# Patient Record
Sex: Female | Born: 1960 | Race: Black or African American | Hispanic: No | Marital: Single | Smoking: Former smoker
Health system: Southern US, Community
[De-identification: ages and names within clinical notes are randomized; demographics above are authoritative.]

## PROBLEM LIST (undated history)

## (undated) DIAGNOSIS — Z21 Asymptomatic human immunodeficiency virus [HIV] infection status: Secondary | ICD-10-CM

## (undated) DIAGNOSIS — B20 Human immunodeficiency virus [HIV] disease: Secondary | ICD-10-CM

## (undated) DIAGNOSIS — F32A Depression, unspecified: Secondary | ICD-10-CM

## (undated) DIAGNOSIS — M1990 Unspecified osteoarthritis, unspecified site: Secondary | ICD-10-CM

## (undated) DIAGNOSIS — F329 Major depressive disorder, single episode, unspecified: Secondary | ICD-10-CM

## (undated) DIAGNOSIS — K219 Gastro-esophageal reflux disease without esophagitis: Secondary | ICD-10-CM

## (undated) DIAGNOSIS — B009 Herpesviral infection, unspecified: Secondary | ICD-10-CM

## (undated) DIAGNOSIS — L309 Dermatitis, unspecified: Secondary | ICD-10-CM

## (undated) DIAGNOSIS — G4700 Insomnia, unspecified: Secondary | ICD-10-CM

## (undated) DIAGNOSIS — E119 Type 2 diabetes mellitus without complications: Secondary | ICD-10-CM

## (undated) DIAGNOSIS — F419 Anxiety disorder, unspecified: Secondary | ICD-10-CM

## (undated) HISTORY — DX: Dermatitis, unspecified: L30.9

## (undated) HISTORY — DX: Insomnia, unspecified: G47.00

## (undated) HISTORY — PX: ABDOMINAL HYSTERECTOMY: SHX81

## (undated) HISTORY — DX: Unspecified osteoarthritis, unspecified site: M19.90

## (undated) HISTORY — DX: Depression, unspecified: F32.A

## (undated) HISTORY — DX: Type 2 diabetes mellitus without complications: E11.9

## (undated) HISTORY — DX: Human immunodeficiency virus (HIV) disease: B20

## (undated) HISTORY — DX: Major depressive disorder, single episode, unspecified: F32.9

## (undated) HISTORY — DX: Anxiety disorder, unspecified: F41.9

## (undated) HISTORY — DX: Gastro-esophageal reflux disease without esophagitis: K21.9

## (undated) HISTORY — PX: COLON SURGERY: SHX602

## (undated) HISTORY — DX: Herpesviral infection, unspecified: B00.9

## (undated) HISTORY — DX: Asymptomatic human immunodeficiency virus (hiv) infection status: Z21

---

## 2007-04-23 ENCOUNTER — Encounter: Admission: RE | Admit: 2007-04-23 | Discharge: 2007-04-23 | Payer: Self-pay | Admitting: Emergency Medicine

## 2010-05-10 ENCOUNTER — Other Ambulatory Visit: Payer: Self-pay | Admitting: Internal Medicine

## 2010-05-10 DIAGNOSIS — Z1231 Encounter for screening mammogram for malignant neoplasm of breast: Secondary | ICD-10-CM

## 2010-05-30 ENCOUNTER — Ambulatory Visit
Admission: RE | Admit: 2010-05-30 | Discharge: 2010-05-30 | Disposition: A | Discharge status: Home / Self Care | Payer: BC Managed Care – PPO | Source: Ambulatory Visit | Attending: Internal Medicine | Admitting: Internal Medicine

## 2010-05-30 DIAGNOSIS — Z1231 Encounter for screening mammogram for malignant neoplasm of breast: Secondary | ICD-10-CM

## 2010-06-02 ENCOUNTER — Other Ambulatory Visit: Payer: Self-pay | Admitting: Internal Medicine

## 2010-06-02 DIAGNOSIS — R928 Other abnormal and inconclusive findings on diagnostic imaging of breast: Secondary | ICD-10-CM

## 2010-06-08 ENCOUNTER — Ambulatory Visit
Admission: RE | Admit: 2010-06-08 | Discharge: 2010-06-08 | Disposition: A | Discharge status: Home / Self Care | Payer: BC Managed Care – PPO | Source: Ambulatory Visit | Attending: Internal Medicine | Admitting: Internal Medicine

## 2010-06-08 DIAGNOSIS — R928 Other abnormal and inconclusive findings on diagnostic imaging of breast: Secondary | ICD-10-CM

## 2010-06-10 ENCOUNTER — Other Ambulatory Visit: Payer: BC Managed Care – PPO

## 2011-02-02 DIAGNOSIS — K219 Gastro-esophageal reflux disease without esophagitis: Secondary | ICD-10-CM | POA: Insufficient documentation

## 2011-04-04 ENCOUNTER — Ambulatory Visit (INDEPENDENT_AMBULATORY_CARE_PROVIDER_SITE_OTHER): Payer: BC Managed Care – PPO

## 2011-04-04 DIAGNOSIS — E7220 Disorder of urea cycle metabolism, unspecified: Secondary | ICD-10-CM

## 2011-04-07 ENCOUNTER — Ambulatory Visit (INDEPENDENT_AMBULATORY_CARE_PROVIDER_SITE_OTHER): Payer: BC Managed Care – PPO

## 2011-04-07 DIAGNOSIS — G4700 Insomnia, unspecified: Secondary | ICD-10-CM

## 2011-04-07 DIAGNOSIS — E119 Type 2 diabetes mellitus without complications: Secondary | ICD-10-CM

## 2011-04-24 ENCOUNTER — Telehealth: Payer: Self-pay

## 2011-04-24 DIAGNOSIS — F5104 Psychophysiologic insomnia: Secondary | ICD-10-CM

## 2011-04-24 MED ORDER — ZOLPIDEM TARTRATE 10 MG PO TABS: 10.0000 mg | ORAL_TABLET | Freq: Every day | ORAL | Status: DC

## 2011-04-24 NOTE — Telephone Encounter (Signed)
.  [facility] FROM [facility] STATES CHELLE WANTED TO KNOW THE LAST TIME PT WAS GIVEN THE MEDICINE AND IT WAS 03-24-11 PLEASE CALL [phone] AND THE FAX IS [phone] IF NEEDED

## 2011-04-24 NOTE — Telephone Encounter (Signed)
Called [facility] at [phone], and notified them they could fill rx... But patient does not have any refills on her Ambien.  Can we rx?

## 2011-04-24 NOTE — Telephone Encounter (Signed)
Ok to fill med.

## 2011-04-24 NOTE — Telephone Encounter (Signed)
LMOM calling in Ambien rx to pharmacy.

## 2011-04-24 NOTE — Telephone Encounter (Signed)
Please call in rx (I ordered it to be phoned in)

## 2011-04-25 ENCOUNTER — Telehealth: Payer: Self-pay | Admitting: Internal Medicine

## 2011-04-25 MED ORDER — ZOLPIDEM TARTRATE ER 12.5 MG PO TBCR: 12.5000 mg | EXTENDED_RELEASE_TABLET | Freq: Every evening | ORAL | Status: DC | PRN

## 2011-04-25 NOTE — Telephone Encounter (Signed)
Pt came into clinic personally and requested AMbien 12.5 XR instead of the Ambien 10 mg sent to pharmacy yesterday in error.  [facility] by Launa Flight at 6:30 to verify Ambien 12.5 XR was correct medication. Ms. Jean Rosenthal called in new Ambien 12.5 prescription per verbal order from Eddie Candle, PA-C.   Pt was advised to return to pharmacy and pick up new prescription and return Ambien 10 mg given in error.

## 2011-05-16 ENCOUNTER — Other Ambulatory Visit: Payer: Self-pay | Admitting: Physician Assistant

## 2011-05-16 MED ORDER — CLONAZEPAM 2 MG PO TABS: ORAL_TABLET | ORAL | Status: DC

## 2011-05-22 ENCOUNTER — Other Ambulatory Visit: Payer: Self-pay | Admitting: Internal Medicine

## 2011-05-22 MED ORDER — ZOLPIDEM TARTRATE ER 12.5 MG PO TBCR: 12.5000 mg | EXTENDED_RELEASE_TABLET | Freq: Every evening | ORAL | Status: DC | PRN

## 2011-06-04 ENCOUNTER — Other Ambulatory Visit: Payer: Self-pay | Admitting: Physician Assistant

## 2011-06-05 ENCOUNTER — Telehealth: Payer: Self-pay | Admitting: Internal Medicine

## 2011-06-05 MED ORDER — AMITRIPTYLINE HCL 100 MG PO TABS: 100.0000 mg | ORAL_TABLET | Freq: Every day | ORAL | Status: DC

## 2011-06-05 NOTE — Telephone Encounter (Signed)
CJ consulted and Elavil refilled for 6 months.

## 2011-06-08 ENCOUNTER — Encounter: Payer: BC Managed Care – PPO | Admitting: Physician Assistant

## 2011-06-14 ENCOUNTER — Other Ambulatory Visit: Payer: Self-pay

## 2011-06-14 MED ORDER — CLONAZEPAM 2 MG PO TABS: ORAL_TABLET | ORAL | Status: DC

## 2011-06-15 ENCOUNTER — Other Ambulatory Visit: Payer: Self-pay | Admitting: Physician Assistant

## 2011-06-15 ENCOUNTER — Ambulatory Visit: Payer: BC Managed Care – PPO | Admitting: Physician Assistant

## 2011-06-27 ENCOUNTER — Encounter: Payer: Self-pay | Admitting: Physician Assistant

## 2011-06-27 ENCOUNTER — Ambulatory Visit: Payer: BC Managed Care – PPO

## 2011-06-27 ENCOUNTER — Ambulatory Visit (INDEPENDENT_AMBULATORY_CARE_PROVIDER_SITE_OTHER): Payer: BC Managed Care – PPO | Admitting: Emergency Medicine

## 2011-06-27 VITALS — BP 120/78 | HR 97 | Temp 98.4°F | Resp 16 | Ht 69.0 in | Wt 207.2 lb

## 2011-06-27 DIAGNOSIS — M25561 Pain in right knee: Secondary | ICD-10-CM

## 2011-06-27 DIAGNOSIS — L2089 Other atopic dermatitis: Secondary | ICD-10-CM

## 2011-06-27 DIAGNOSIS — M25562 Pain in left knee: Secondary | ICD-10-CM

## 2011-06-27 DIAGNOSIS — L209 Atopic dermatitis, unspecified: Secondary | ICD-10-CM

## 2011-06-27 DIAGNOSIS — M25569 Pain in unspecified knee: Secondary | ICD-10-CM

## 2011-06-27 DIAGNOSIS — G4700 Insomnia, unspecified: Secondary | ICD-10-CM

## 2011-06-27 MED ORDER — MELOXICAM 7.5 MG PO TABS: 7.5000 mg | ORAL_TABLET | Freq: Every day | ORAL | Status: DC

## 2011-06-27 MED ORDER — CYCLOBENZAPRINE HCL 10 MG PO TABS: 10.0000 mg | ORAL_TABLET | Freq: Three times a day (TID) | ORAL | Status: AC | PRN

## 2011-06-27 MED ORDER — BETAMETHASONE VALERATE 0.1 % EX CREA: TOPICAL_CREAM | Freq: Two times a day (BID) | CUTANEOUS | Status: DC

## 2011-06-27 MED ORDER — ZOLPIDEM TARTRATE ER 12.5 MG PO TBCR: 12.5000 mg | EXTENDED_RELEASE_TABLET | Freq: Every evening | ORAL | Status: DC | PRN

## 2011-06-27 NOTE — Progress Notes (Signed)
  Subjective:    Patient ID: Tracy Andrade, female    DOB: 14-May-1960, 51 y.o.   MRN: [id number]  HPI  Has developed a rash on the both wrist, abdomen and right upper thigh.  Symptoms began  on the thigh over a year ago, but the other areas began 1-2 weeks ago.  Has had same previously with goot relief with betamethasone valerate 0.1% cream.  Betamethasone proprionate 0.05% cream has not helped. Mildly itchy. Not associated with change in soap, detergent, lotion, etc.   Bilateral knee pain with sit to stand and stand to sit x 2 weeks.  Has tried a friend's hydrocodone, with good relief. OTC ibuprofen and acetaminophen and tramadol (also a friend's) without benefit. No pain with walking, standing.  No trauma/injury.  Friend from [place] has moved in, with her dog, yesterday.  Some stress, but working out the living arrangement.  Needs a refill of Flexeril for low back pain. 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  Review of Systems As above.    Objective:   Physical Exam Vital signs noted. Well-developed, well nourished BF who is awake, alert and oriented, in NAD. HEENT: [place]/AT, sclera and conjunctiva are clear.   Neck: supple, non-tender, no lymphadenopathy, thyromegaly. Heart: RRR, no murmur Lungs: CTA Skin: warm and dry with atopic rash on the right upper thigh, right volar wrist, left forearm, and anterior abdomen.  Bilateral Knee Films: [facility] reading (PRIMARY) by  Dr. Cleta Alberts. Medial joint space narrowing bilaterally.      Assessment & Plan:   1. Atopic dermatitis  betamethasone valerate (VALISONE) 0.1 % cream  2. Knee pain, bilateral  DG Knee 1-2 Views Left, DG Knee 1-2 Views Right, Ambulatory referral to Orthopedic Surgery, DISCONTINUED: meloxicam (MOBIC) 7.5 MG tablet  3. Insomnia  zolpidem (AMBIEN CR) 12.5 MG CR tablet   Discussed with Dr. Cleta Alberts.

## 2011-06-27 NOTE — Patient Instructions (Signed)
Call the office if you have not heard about your referral appointment by the end of the week.

## 2011-07-03 ENCOUNTER — Telehealth: Payer: Self-pay

## 2011-07-03 NOTE — Telephone Encounter (Signed)
PT REQUESTS X-RAY DISCS FROM HER LAST VISIT  APPT HAS BEEN SET UP W/ ORTHO FOR 07/08/11 AND SHE NEEDS TO TAKE THESE WITH HER TO HER APPT   PLEASE CALL WHEN READY

## 2011-07-04 NOTE — Telephone Encounter (Signed)
Cd made and pt notified on VM ready for pick up.

## 2011-07-10 DIAGNOSIS — N893 Dysplasia of vagina, unspecified: Secondary | ICD-10-CM | POA: Insufficient documentation

## 2011-07-14 ENCOUNTER — Other Ambulatory Visit: Payer: Self-pay | Admitting: Physician Assistant

## 2011-07-14 ENCOUNTER — Telehealth: Payer: Self-pay | Admitting: Internal Medicine

## 2011-07-14 MED ORDER — CLONAZEPAM 2 MG PO TABS: ORAL_TABLET | ORAL | Status: DC

## 2011-07-14 MED ORDER — AMITRIPTYLINE HCL 100 MG PO TABS: 100.0000 mg | ORAL_TABLET | Freq: Every day | ORAL | Status: DC

## 2011-07-14 NOTE — Telephone Encounter (Signed)
Refilled Klonopin #[id number] no refills.

## 2011-07-22 ENCOUNTER — Ambulatory Visit (INDEPENDENT_AMBULATORY_CARE_PROVIDER_SITE_OTHER): Payer: BC Managed Care – PPO | Admitting: Physician Assistant

## 2011-07-22 VITALS — BP 109/79 | HR 102 | Temp 98.2°F | Resp 18 | Ht 69.0 in | Wt 204.6 lb

## 2011-07-22 DIAGNOSIS — M549 Dorsalgia, unspecified: Secondary | ICD-10-CM

## 2011-07-22 DIAGNOSIS — B20 Human immunodeficiency virus [HIV] disease: Secondary | ICD-10-CM

## 2011-07-22 DIAGNOSIS — M25569 Pain in unspecified knee: Secondary | ICD-10-CM

## 2011-07-22 DIAGNOSIS — L259 Unspecified contact dermatitis, unspecified cause: Secondary | ICD-10-CM

## 2011-07-22 DIAGNOSIS — L309 Dermatitis, unspecified: Secondary | ICD-10-CM

## 2011-07-22 DIAGNOSIS — F419 Anxiety disorder, unspecified: Secondary | ICD-10-CM

## 2011-07-22 DIAGNOSIS — F411 Generalized anxiety disorder: Secondary | ICD-10-CM

## 2011-07-22 DIAGNOSIS — G4700 Insomnia, unspecified: Secondary | ICD-10-CM

## 2011-07-22 DIAGNOSIS — F329 Major depressive disorder, single episode, unspecified: Secondary | ICD-10-CM

## 2011-07-22 MED ORDER — HYDROCODONE-ACETAMINOPHEN 5-325 MG PO TABS: 1.0000 | ORAL_TABLET | Freq: Four times a day (QID) | ORAL | Status: AC | PRN

## 2011-07-22 MED ORDER — AMITRIPTYLINE HCL 100 MG PO TABS: 100.0000 mg | ORAL_TABLET | Freq: Every day | ORAL | Status: DC

## 2011-07-22 MED ORDER — SERTRALINE HCL 100 MG PO TABS: 150.0000 mg | ORAL_TABLET | Freq: Every day | ORAL | Status: DC

## 2011-07-22 NOTE — Patient Instructions (Addendum)
Come in to see Dr. Eula Listen on Tuesday 08/01/11 at 6 pm about your back and knee pain. Use good body mechanics when working with your clients.

## 2011-07-22 NOTE — Progress Notes (Signed)
  Subjective:    Patient ID: Tracy Andrade, female    DOB: 1961-02-12, 51 y.o.   MRN: [id number]  HPI  Presents with several concerns: 1. Back pain has recurred.  Meloxicam and Flexeril are no longer effective. 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  She is able to do the work, but when it's time to straighten back up, she experiences severe low back pain. No radiation, no paresthesias, no weakness, no loss of bowel or bladder control.  2. Bilateral knee pain with walking, rising to stand from seated position.  No swelling.  Meloxicam no longer effective.  3. The itchy rash on her trunk, around the waist band hasn't resolved, but is less itchy.  She has developed similar lesions on the back of her thighs and right axilla.  Bathes not more than once daily.  Uses warm, not hot, water.  4. Her position at [employer] is being eliminated in down-sizing.  Her last day will be 5/24.  She will be paid her accumulated PTO, but will lose her health insurance unless she pays for COBRA.  She is working with the staff at [facility] to enroll in patient assistance programs for her HIV meds, each of which cost >$1000/month.  Review of Systems As above.    Objective:   Physical Exam Vital signs noted. Well-developed, well nourished BF who is awake, alert and oriented, in NAD. HEENT: [place]/AT, sclera and conjunctiva are clear.   Neck: supple, non-tender, no lymphadenopathy, thyromegaly. Heart: RRR, no murmur Lungs: CTA Abdomen: normo-active bowel sounds, supple, non-tender, no mass or organomegaly. Extremities: no cyanosis, clubbing or edema. Skin: warm and very dry. Mildly scaled and hyperpigmented rounded lesions round the anterior waist line, posterior upper thighs, and right upper arm/shoulder area. No induration. No drainage.     Assessment & Plan:   1. Depression  sertraline (ZOLOFT) 100 MG tablet, amitriptyline (ELAVIL) 100 MG tablet  2. Insomnia   amitriptyline (ELAVIL) 100 MG tablet  3. Eczema  Continue with good skin hygiene and use of steroid cream.  4. HIV (human immunodeficiency virus infection)  Continue F/U at [facility]  5. Anxiety  sertraline (ZOLOFT) 100 MG tablet, amitriptyline (ELAVIL) 100 MG tablet  6. Back pain  HYDROcodone-acetaminophen (NORCO) 5-325 MG per tablet. Institute proper body mechanics to reduce strain on back, increase core muscle strength. If she elects COBRA coverage, will refer to PT to assist.  7. Knee pain  HYDROcodone-acetaminophen (NORCO) 5-325 MG per tablet. RTC 5/14 to see Eula Listen, MD.   Patient Instructions  Come in to see Dr. Eula Listen on Tuesday 08/01/11 at 6 pm about your back and knee pain. Use good body mechanics when working with your clients.

## 2011-08-01 ENCOUNTER — Ambulatory Visit (INDEPENDENT_AMBULATORY_CARE_PROVIDER_SITE_OTHER): Payer: BC Managed Care – PPO | Admitting: Family Medicine

## 2011-08-01 VITALS — BP 129/81 | HR 85 | Temp 97.9°F | Resp 16 | Ht 69.18 in | Wt 203.6 lb

## 2011-08-01 DIAGNOSIS — M25569 Pain in unspecified knee: Secondary | ICD-10-CM

## 2011-08-01 DIAGNOSIS — M545 Low back pain: Secondary | ICD-10-CM

## 2011-08-01 MED ORDER — CYCLOBENZAPRINE HCL 10 MG PO TABS: ORAL_TABLET | ORAL | Status: DC

## 2011-08-01 MED ORDER — DICLOFENAC SODIUM 1 % TD GEL: 1.0000 "application " | Freq: Three times a day (TID) | TRANSDERMAL | Status: DC

## 2011-08-01 NOTE — Progress Notes (Signed)
  Subjective:    Patient ID: Tracy Andrade, female    DOB: Jul 27, 1960, 51 y.o.   MRN: [id number]  HPI Comments: F/U for bilateral knee pain.  Painful for a couple weeks.  No swelling, locking or giving way. Using hydrocodone and meloxicam for pain.  No relief.  Knee pain worse with squatting.  Also,  C/o LBP.  Localized.  No intermittent "all over my body" numbness.  No fever or night pain. Intentional few pound weight loss.   Knee Pain       Review of Systems     Objective:   Physical Exam  Cardiovascular:       No swelling  Skin: Skin is warm, dry and intact. No abrasion, no bruising and no rash noted. No erythema.   WDWN.NAD Low Back:  NT. Neg. SLR  With seated or lying Neuro: full strength.  Normal DTRs. Normal muscle tone Knees: No swelling.  Mildly positive patella compression test bilaterally.  Stable ligaments. Neg mcmurray's bilaterally.        Assessment & Plan:   1. Lower back pain  cyclobenzaprine (FLEXERIL) 10 MG tablet  2. Knee pain  diclofenac sodium (VOLTAREN) 1 % GEL  Refer to PT for back and knee pain 2 times per week for 4 weeks. Recommended water aerobics. Follow up as needed.

## 2011-08-01 NOTE — Patient Instructions (Signed)
Back Pain, Adult Low back pain is very common. About 1 in 5 people have back pain.The cause of low back pain is rarely dangerous. The pain often gets better over time.About half of people with a sudden onset of back pain feel better in just 2 weeks. About 8 in 10 people feel better by 6 weeks.  CAUSES Some common causes of back pain include:  Strain of the muscles or ligaments supporting the spine.   Wear and tear (degeneration) of the spinal discs.   Arthritis.   Direct injury to the back.  DIAGNOSIS Most of the time, the direct cause of low back pain is not known.However, back pain can be treated effectively even when the exact cause of the pain is unknown.Answering your caregiver's questions about your overall health and symptoms is one of the most accurate ways to make sure the cause of your pain is not dangerous. If your caregiver needs more information, he or she may order lab work or imaging tests (X-rays or MRIs).However, even if imaging tests show changes in your back, this usually does not require surgery. HOME CARE INSTRUCTIONS For many people, back pain returns.Since low back pain is rarely dangerous, it is often a condition that people can learn to manageon their own.   Remain active. It is stressful on the back to sit or stand in one place. Do not sit, drive, or stand in one place for more than 30 minutes at a time. Take short walks on level surfaces as soon as pain allows.Try to increase the length of time you walk each day.   Do not stay in bed.Resting more than 1 or 2 days can delay your recovery.   Do not avoid exercise or work.Your body is made to move.It is not dangerous to be active, even though your back may hurt.Your back will likely heal faster if you return to being active before your pain is gone.   Pay attention to your body when you bend and lift. Many people have less discomfortwhen lifting if they bend their knees, keep the load close to their  bodies,and avoid twisting. Often, the most comfortable positions are those that put less stress on your recovering back.   Find a comfortable position to sleep. Use a firm mattress and lie on your side with your knees slightly bent. If you lie on your back, put a pillow under your knees.   Only take over-the-counter or prescription medicines as directed by your caregiver. Over-the-counter medicines to reduce pain and inflammation are often the most helpful.Your caregiver may prescribe muscle relaxant drugs.These medicines help dull your pain so you can more quickly return to your normal activities and healthy exercise.   Put ice on the injured area.   Put ice in a plastic bag.   Place a towel between your skin and the bag.   Leave the ice on for 15 to 20 minutes, 3 to 4 times a day for the first 2 to 3 days. After that, ice and heat may be alternated to reduce pain and spasms.   Ask your caregiver about trying back exercises and gentle massage. This may be of some benefit.   Avoid feeling anxious or stressed.Stress increases muscle tension and can worsen back pain.It is important to recognize when you are anxious or stressed and learn ways to manage it.Exercise is a great option.  SEEK MEDICAL CARE IF:  You have pain that is not relieved with rest or medicine.   You have   pain that does not improve in 1 week.   You have new symptoms.   You are generally not feeling well.  SEEK IMMEDIATE MEDICAL CARE IF:   You have pain that radiates from your back into your legs.   You develop new bowel or bladder control problems.   You have unusual weakness or numbness in your arms or legs.   You develop nausea or vomiting.   You develop abdominal pain.   You feel faint.  Document Released: 03/06/2005 Document Revised: 02/23/2011 Document Reviewed: 07/25/2010 [facility] Patient Information 2012 [place], [place].Knee Pain The knee is the complex joint between your thigh and your lower leg.  It is made up of bones, tendons, ligaments, and cartilage. The bones that make up the knee are:  The femur in the thigh.   The tibia and fibula in the lower leg.   The patella or kneecap riding in the groove on the lower femur.  CAUSES  Knee pain is a common complaint with many causes. A few of these causes are:  Injury, such as:   A ruptured ligament or tendon injury.   Torn cartilage.   Medical conditions, such as:   Gout   Arthritis   Infections   Overuse, over training or overdoing a physical activity.  Knee pain can be minor or severe. Knee pain can accompany debilitating injury. Minor knee problems often respond well to self-care measures or get well on their own. More serious injuries may need medical intervention or even surgery. SYMPTOMS The knee is complex. Symptoms of knee problems can vary widely. Some of the problems are:  Pain with movement and weight bearing.   Swelling and tenderness.   Buckling of the knee.   Inability to straighten or extend your knee.   Your knee locks and you cannot straighten it.   Warmth and redness with pain and fever.   Deformity or dislocation of the kneecap.  DIAGNOSIS  Determining what is wrong may be very straight forward such as when there is an injury. It can also be challenging because of the complexity of the knee. Tests to make a diagnosis may include:  Your caregiver taking a history and doing a physical exam.   Routine X-rays can be used to rule out other problems. X-rays will not reveal a cartilage tear. Some injuries of the knee can be diagnosed by:   Arthroscopy a surgical technique by which a small video camera is inserted through tiny incisions on the sides of the knee. This procedure is used to examine and repair internal knee joint problems. Tiny instruments can be used during arthroscopy to repair the torn knee cartilage (meniscus).   Arthrography is a radiology technique. A contrast liquid is directly  injected into the knee joint. Internal structures of the knee joint then become visible on X-ray film.   An MRI scan is a non x-ray radiology procedure in which magnetic fields and a computer produce two- or three-dimensional images of the inside of the knee. Cartilage tears are often visible using an MRI scanner. MRI scans have largely replaced arthrography in diagnosing cartilage tears of the knee.   Blood work.   Examination of the fluid that helps to lubricate the knee joint (synovial fluid). This is done by taking a sample out using a needle and a syringe.  TREATMENT The treatment of knee problems depends on the cause. Some of these treatments are:  Depending on the injury, proper casting, splinting, surgery or physical therapy care will  be needed.   Give yourself adequate recovery time. Do not overuse your joints. If you begin to get sore during workout routines, back off. Slow down or do fewer repetitions.   For repetitive activities such as cycling or running, maintain your strength and nutrition.   Alternate muscle groups. For example if you are a weight lifter, work the upper body on one day and the lower body the next.   Either tight or weak muscles do not give the proper support for your knee. Tight or weak muscles do not absorb the stress placed on the knee joint. Keep the muscles surrounding the knee strong.   Take care of mechanical problems.   If you have flat feet, orthotics or special shoes may help. See your caregiver if you need help.   Arch supports, sometimes with wedges on the inner or outer aspect of the heel, can help. These can shift pressure away from the side of the knee most bothered by osteoarthritis.   A brace called an "unloader" brace also may be used to help ease the pressure on the most arthritic side of the knee.   If your caregiver has prescribed crutches, braces, wraps or ice, use as directed. The acronym for this is PRICE. This means protection,  rest, ice, compression and elevation.   Nonsteroidal anti-inflammatory drugs (NSAID's), can help relieve pain. But if taken immediately after an injury, they may actually increase swelling. Take NSAID's with food in your stomach. Stop them if you develop stomach problems. Do not take these if you have a history of ulcers, stomach pain or bleeding from the bowel. Do not take without your caregiver's approval if you have problems with fluid retention, heart failure, or kidney problems.   For ongoing knee problems, physical therapy may be helpful.   Glucosamine and chondroitin are over-the-counter dietary supplements. Both may help relieve the pain of osteoarthritis in the knee. These medicines are different from the usual anti-inflammatory drugs. Glucosamine may decrease the rate of cartilage destruction.   Injections of a corticosteroid drug into your knee joint may help reduce the symptoms of an arthritis flare-up. They may provide pain relief that lasts a few months. You may have to wait a few months between injections. The injections do have a small increased risk of infection, water retention and elevated blood sugar levels.   Hyaluronic acid injected into damaged joints may ease pain and provide lubrication. These injections may work by reducing inflammation. A series of shots may give relief for as long as 6 months.   Topical painkillers. Applying certain ointments to your skin may help relieve the pain and stiffness of osteoarthritis. Ask your pharmacist for suggestions. Many over the-counter products are approved for temporary relief of arthritis pain.   In some countries, doctors often prescribe topical NSAID's for relief of chronic conditions such as arthritis and tendinitis. A review of treatment with NSAID creams found that they worked as well as oral medications but without the serious side effects.  PREVENTION  Maintain a healthy weight. Extra pounds put more strain on your joints.    Get strong, stay limber. Weak muscles are a common cause of knee injuries. Stretching is important. Include flexibility exercises in your workouts.   Be smart about exercise. If you have osteoarthritis, chronic knee pain or recurring injuries, you may need to change the way you exercise. This does not mean you have to stop being active. If your knees ache after jogging or playing basketball, consider switching  to swimming, water aerobics or other low-impact activities, at least for a few days a week. Sometimes limiting high-impact activities will provide relief.   Make sure your shoes fit well. Choose footwear that is right for your sport.   Protect your knees. Use the proper gear for knee-sensitive activities. Use kneepads when playing volleyball or laying carpet. Buckle your seat belt every time you drive. Most shattered kneecaps occur in car accidents.   Rest when you are tired.  SEEK MEDICAL CARE IF:  You have knee pain that is continual and does not seem to be getting better.  SEEK IMMEDIATE MEDICAL CARE IF:  Your knee joint feels hot to the touch and you have a high fever. MAKE SURE YOU:   Understand these instructions.   Will watch your condition.   Will get help right away if you are not doing well or get worse.  Document Released: 01/01/2007 Document Revised: 02/23/2011 Document Reviewed: 01/01/2007 [facility] Patient Information 2012 [place], [place].

## 2011-08-04 NOTE — Progress Notes (Signed)
Completed prior auth form for pt's Rx for Voltaren gel and form being faxed to [facility] of [place].

## 2011-08-04 NOTE — Progress Notes (Signed)
Prior auth form completed and faxed in for pt's Rx for voltaren gel.

## 2011-08-07 ENCOUNTER — Other Ambulatory Visit: Payer: Self-pay | Admitting: Physician Assistant

## 2011-08-09 ENCOUNTER — Other Ambulatory Visit: Payer: Self-pay | Admitting: Family Medicine

## 2011-08-09 DIAGNOSIS — G4700 Insomnia, unspecified: Secondary | ICD-10-CM

## 2011-08-09 MED ORDER — ZOLPIDEM TARTRATE ER 12.5 MG PO TBCR: 12.5000 mg | EXTENDED_RELEASE_TABLET | Freq: Every evening | ORAL | Status: DC | PRN

## 2011-08-09 MED ORDER — CLONAZEPAM 2 MG PO TABS: ORAL_TABLET | ORAL | Status: DC

## 2011-08-10 ENCOUNTER — Other Ambulatory Visit: Payer: Self-pay

## 2011-08-10 NOTE — Telephone Encounter (Signed)
betamethasone valerate (VALISONE) 0.1 % cream Pharmacy has requested this via e-script and has had no reply

## 2011-08-10 NOTE — Progress Notes (Signed)
Called to check on prior auth status and was told it was denied because "NSAID had not been tried/failed and would be used w/ the voltaren gel", but was told I could send in a note w/clarification/additional info to appeal decision. Wrote note and faxed to [facility] [place] stating that pt has been using/tried and failed ibuprofen and then meloxicam beginning w/ 4/9 OV and was Rxd Voltaren at last OV, along w/Norco since the NSAIDS were not working. Received confirmation fax went through.

## 2011-08-11 NOTE — Telephone Encounter (Signed)
Done via re-fill request encounter

## 2011-08-15 ENCOUNTER — Other Ambulatory Visit: Payer: Self-pay | Admitting: Physician Assistant

## 2011-08-15 ENCOUNTER — Telehealth: Payer: Self-pay

## 2011-08-15 NOTE — Telephone Encounter (Signed)
Tracy Andrade  Pt would like for you to call her regarding her knees

## 2011-08-15 NOTE — Telephone Encounter (Signed)
Pt called stating that her pharmacy has not received he rx for hydrocodone.

## 2011-08-16 NOTE — Telephone Encounter (Signed)
Pt CB to ask about status of RF req for the hydrocodone that Chelle had Rxd. Stated that her pharmacy has requested the RF but they haven't heard back. Chelle, pt wanted to talk with you to let you know she is still having the same amount of pain in knee. Dr Althea Charon had wanted her to see a physical therapist, but they could not see her until June 3rd. Pt reports that she got laid off of her job and her insurance runs out 5/31. She can not afford to go to the PT w/out insurance. Pt also wanted me to let Chelle know that Dr Althea Charon had wanted her to use a topical cream for knee pain but it was not covered by her ins, and so she is calling back to see if she can get a RF of the hydrocodone that Chelle Rxd on 07/22/11. Also wanted Chelle's advice as to what she can do for f/up w/out insurance.

## 2011-08-16 NOTE — Telephone Encounter (Signed)
Ok x 1

## 2011-08-17 ENCOUNTER — Other Ambulatory Visit: Payer: Self-pay | Admitting: *Deleted

## 2011-08-17 MED ORDER — HYDROCODONE-ACETAMINOPHEN 5-325 MG PO TABS: 1.0000 | ORAL_TABLET | Freq: Four times a day (QID) | ORAL | Status: AC | PRN

## 2011-08-17 NOTE — Telephone Encounter (Signed)
Pt CB to ask again about her medication and wanted Chelle to know that the ins has denied the topical cream for pain twice, even after we appealed it.

## 2011-08-17 NOTE — Telephone Encounter (Signed)
The hydrocodone has been authorized and sent to the pharmacy.  Please call and verify they received it.    Without insurance, she can call the [facility] financial aid office [phone]), and Christena Flake here [phone] can help if they are not forthcoming.

## 2011-08-18 NOTE — Telephone Encounter (Signed)
Advised pt that rx was sent in 

## 2011-08-28 ENCOUNTER — Other Ambulatory Visit: Payer: Self-pay | Admitting: Physician Assistant

## 2011-08-29 ENCOUNTER — Telehealth: Payer: Self-pay

## 2011-08-29 NOTE — Telephone Encounter (Signed)
PATIENT WAS HERE RECENTLY AND IS STILL HAVING A COUGH.  SHE NO LONGER HAS INSURANCE AND WAS ASKING ABOUT COSTS.  I THINK SHE HAS SOME QUESTIONS ABOUT THIS LINGERING COUGH AND IF WE SHOULD CALL HER IN SOMETHING

## 2011-08-29 NOTE — Telephone Encounter (Signed)
Does not appear pt has been here for a cough since we've been on EPIC. Please advise

## 2011-08-30 MED ORDER — BENZONATATE 100 MG PO CAPS: 100.0000 mg | ORAL_CAPSULE | Freq: Three times a day (TID) | ORAL | Status: AC | PRN

## 2011-08-30 NOTE — Telephone Encounter (Signed)
Left message for call back.

## 2011-08-30 NOTE — Telephone Encounter (Signed)
Please advise patient that I sent in [facility] for her cough.  If her cough persists, we'll need to re-evaluate her here.  Remind her to call the financial aid office (I gave her the number in a previous message [phone], and call Okey Regal here at ext [phone] if she has any problems), as they can help reduce the cost of her visits here!

## 2011-08-31 NOTE — Telephone Encounter (Signed)
Patient notified

## 2011-09-03 ENCOUNTER — Other Ambulatory Visit: Payer: Self-pay | Admitting: Physician Assistant

## 2011-09-11 ENCOUNTER — Telehealth: Payer: Self-pay

## 2011-09-11 DIAGNOSIS — G4700 Insomnia, unspecified: Secondary | ICD-10-CM

## 2011-09-11 MED ORDER — CLONAZEPAM 2 MG PO TABS: ORAL_TABLET | ORAL | Status: DC

## 2011-09-11 MED ORDER — ZOLPIDEM TARTRATE ER 12.5 MG PO TBCR: 12.5000 mg | EXTENDED_RELEASE_TABLET | Freq: Every evening | ORAL | Status: DC | PRN

## 2011-09-11 NOTE — Telephone Encounter (Signed)
Rxs printed.  

## 2011-09-11 NOTE — Telephone Encounter (Signed)
Patient of Chelle.  Says pharmacy sent request of Janit Bern & Klonopin on the 20th.  She is completely out of her medicine.  Please call

## 2011-09-12 NOTE — Telephone Encounter (Signed)
[facility] [facility] for pt that request has been done.

## 2011-10-10 ENCOUNTER — Other Ambulatory Visit: Payer: Self-pay

## 2011-10-10 DIAGNOSIS — G4700 Insomnia, unspecified: Secondary | ICD-10-CM

## 2011-10-10 MED ORDER — CLONAZEPAM 2 MG PO TABS: ORAL_TABLET | ORAL | Status: DC

## 2011-10-10 MED ORDER — ZOLPIDEM TARTRATE ER 12.5 MG PO TBCR: 12.5000 mg | EXTENDED_RELEASE_TABLET | Freq: Every evening | ORAL | Status: DC | PRN

## 2011-10-16 ENCOUNTER — Telehealth: Payer: Self-pay

## 2011-10-16 DIAGNOSIS — F329 Major depressive disorder, single episode, unspecified: Secondary | ICD-10-CM

## 2011-10-16 DIAGNOSIS — G4700 Insomnia, unspecified: Secondary | ICD-10-CM

## 2011-10-16 DIAGNOSIS — F419 Anxiety disorder, unspecified: Secondary | ICD-10-CM

## 2011-10-16 NOTE — Telephone Encounter (Signed)
CHELLE:  PT STATES SHE NO LONGER HAS HEALTH INSURANCE AND [facility] IS ABLE TO OFFER HER A DISCOUNT PROGRAM BUT THE PROGRAM DOES NOT COVER AMBIEN CR OR KLONOPIN AND WOULD LIKE SOMETHING ELSE PRESCRIBED AND WOULD LIKE A CALL BACK FROM YOU   THE NAME OF THE PROGRAM IS "AIDAT" (PT NOT SURE OF SPELLING) SAYS IT IS A FORM OF MEDICAID    PT PHONE [phone]

## 2011-10-17 NOTE — Telephone Encounter (Signed)
Ambien CR $ 50 and the Klonopin $76 can not afford these (not covered). Please advise if we should change these, she has AIDAT plan that covers her HIV meds will also cover the Elavil 100 mg at bedtime and Zoloft 100mg  takes 1 and 1/2 daily needs these sent to [facility] 800 [phone]

## 2011-10-17 NOTE — Telephone Encounter (Signed)
We can change from Ambien CR to regular Ambien. Recommend getting both Klonopin and Ambien filled at [facility] and likely will be cheaper.

## 2011-10-17 NOTE — Telephone Encounter (Signed)
Lm for her, need to know what is covered.

## 2011-10-17 NOTE — Telephone Encounter (Signed)
Understood, but what about the Elavil and Zoloft?  Can we rx these to [facility]?

## 2011-10-18 MED ORDER — SERTRALINE HCL 100 MG PO TABS: 150.0000 mg | ORAL_TABLET | Freq: Every day | ORAL | Status: DC

## 2011-10-18 MED ORDER — ZOLPIDEM TARTRATE 10 MG PO TABS: 10.0000 mg | ORAL_TABLET | Freq: Every evening | ORAL | Status: DC | PRN

## 2011-10-18 MED ORDER — AMITRIPTYLINE HCL 100 MG PO TABS: 100.0000 mg | ORAL_TABLET | Freq: Every day | ORAL | Status: DC

## 2011-10-18 NOTE — Telephone Encounter (Signed)
Tracy Andrade can be sent to [facility] order

## 2011-10-18 NOTE — Telephone Encounter (Signed)
Sent Rx for her to [facility] order and left mssg for her to call me back about these, I need to advise her on plan for Ambien and Klonopin

## 2011-10-19 NOTE — Telephone Encounter (Signed)
Pt CB and I advised her that wlavil and zoloft have been sent to her mail order. Suggested that pt call [facility] and possibly [facility] to check cost for Ambien, Ambien CR and Klonopin and then let us know if and where she would like these Rxs sent. Pt agreed.

## 2011-10-20 NOTE — Telephone Encounter (Signed)
The patient called and stated that she wanted her Zoloft and Amitriptyline sent into the [facility] # [phone].  The patient stated this is part of the [employer] ?sp program and they cover these meds.  The patient stated they do not cover her other other medicines.  The patient would like a return call regarding this at [phone].

## 2011-10-20 NOTE — Telephone Encounter (Signed)
I called her and she want the Ambien [facility]. She was advised the other Rx have been sent

## 2011-10-21 ENCOUNTER — Telehealth: Payer: Self-pay

## 2011-10-21 MED ORDER — CLONAZEPAM 2 MG PO TABS: ORAL_TABLET | ORAL | Status: DC

## 2011-10-21 NOTE — Telephone Encounter (Signed)
Sent in

## 2011-10-21 NOTE — Telephone Encounter (Signed)
Pt. Needs prescription for Clonazepam to be faxed to [facility]. Pt stated this Was left out from previous request. Please let pt know when done.  [phone]

## 2011-10-21 NOTE — Telephone Encounter (Signed)
Faxed to [facility]. Called pt to let her know.

## 2011-11-10 ENCOUNTER — Other Ambulatory Visit: Payer: Self-pay | Admitting: Physician Assistant

## 2011-11-22 ENCOUNTER — Telehealth: Payer: Self-pay

## 2011-11-22 NOTE — Telephone Encounter (Signed)
PT STATES SHE WAS AROUND HER BOSS AND HER BOSS HAD THE PINK [employer] AND NOW SHE HAS IT. WOULD LIKE TO HAVE SOMETHING CALLED IN SINCE SHE DOESN'T HAVE INSURANCE. PLEASE CALL [phone]    [facility]

## 2011-11-22 NOTE — Telephone Encounter (Signed)
Please call patient and get details of her eye symptoms.

## 2011-11-23 ENCOUNTER — Ambulatory Visit: Payer: BC Managed Care – PPO | Admitting: Physician Assistant

## 2011-11-23 NOTE — Telephone Encounter (Signed)
LMOM to CB. 

## 2011-11-23 NOTE — Telephone Encounter (Signed)
Called pt LMOM to CB. 

## 2011-11-24 NOTE — Telephone Encounter (Signed)
LMOM to CB if she still needs our help with this?

## 2011-11-30 ENCOUNTER — Ambulatory Visit: Payer: BC Managed Care – PPO | Admitting: Physician Assistant

## 2011-12-05 ENCOUNTER — Other Ambulatory Visit: Payer: Self-pay | Admitting: Physician Assistant

## 2011-12-06 ENCOUNTER — Other Ambulatory Visit: Payer: Self-pay | Admitting: Physician Assistant

## 2011-12-07 ENCOUNTER — Telehealth: Payer: Self-pay

## 2011-12-07 NOTE — Telephone Encounter (Signed)
Rendy needs Ambien refill that she requested from [facility] on [address], but was not received.  ambien 10 mg 1xdaily.  [phone]

## 2011-12-08 ENCOUNTER — Other Ambulatory Visit: Payer: Self-pay | Admitting: *Deleted

## 2011-12-08 MED ORDER — ZOLPIDEM TARTRATE 10 MG PO TABS: 10.0000 mg | ORAL_TABLET | Freq: Every evening | ORAL | Status: DC | PRN

## 2011-12-08 NOTE — Telephone Encounter (Signed)
I have authorized this on a paper request. It will be going through shortly. Either way she should be fine until 12/11/11 as her last refill was on 11/10/11.

## 2011-12-08 NOTE — Telephone Encounter (Signed)
Notified pt RF was sent to [facility].

## 2011-12-08 NOTE — Telephone Encounter (Signed)
It looks like we just RFd her Klonopin, but I do not see a request from pharm for pt's Ambien. Can we RF?

## 2011-12-12 ENCOUNTER — Telehealth: Payer: Self-pay

## 2011-12-12 NOTE — Telephone Encounter (Signed)
PT STATES SHE HAVE LOST HER JOB AND CANNOT AFFORD TO COME IN, SHE WAS ON AMBIEN CR AND IT WORKED REALLY GOOD BUT SHE CAN'T AFFORD IT AND THE REGULAR AMBIEN DOESN'T WORK. WOULD LIKE TO KNOW IF SHE CAN DOUBLE UP ON THE KLONOPIN OR CAN SHE TAKE SOMETHING ELSE. HAVE TRIED OVER THE COUNTER MEDICINE AND IT DOESN'T HELP EITHER. PLEASE CALL [phone]    [facility]

## 2011-12-12 NOTE — Telephone Encounter (Signed)
She is already on the max dose for both medications for her diagnosis. Has she tried any other prescription medications for sleep?

## 2011-12-13 NOTE — Telephone Encounter (Signed)
I have left message for her to call me back  

## 2011-12-13 NOTE — Telephone Encounter (Signed)
She states she has only tried Ambien and Ambien CR she is asking if she can try Trazodone? Would like to try this she has not tried before.

## 2011-12-14 ENCOUNTER — Telehealth: Payer: Self-pay

## 2011-12-14 DIAGNOSIS — G4700 Insomnia, unspecified: Secondary | ICD-10-CM

## 2011-12-14 MED ORDER — TRAZODONE HCL 50 MG PO TABS: 25.0000 mg | ORAL_TABLET | Freq: Every evening | ORAL | Status: DC | PRN

## 2011-12-14 NOTE — Telephone Encounter (Signed)
PT IS STILL WAITING TO HEAR FROM [place] ABOUT SWITCHING HER SLEEPING MEDICATION.  WOULD LIKE TO TRY 100 MG TRAZADONE.  [phone]

## 2011-12-14 NOTE — Telephone Encounter (Signed)
Spoke with pt advised Rx sent in. Pt understood 

## 2011-12-14 NOTE — Telephone Encounter (Signed)
Patient is unable to afford Ambien CR and regular Ambien does not help, Klonopin does not help she wants to try Trazodone 100mg / please advise

## 2011-12-14 NOTE — Telephone Encounter (Signed)
Since she's on Elavil aleady, we need to start out with a lower dose of Trazodone.  I've sent in 50 mg, and she should take 1/2 tab each evening for the first week, then increase to the whole tablet.  Call back in 2 weeks with how she's doing.

## 2011-12-15 ENCOUNTER — Telehealth: Payer: Self-pay

## 2011-12-15 NOTE — Telephone Encounter (Signed)
Yes she should stop the Ambien I called her did not get answer will try again later.

## 2011-12-15 NOTE — Telephone Encounter (Signed)
PATIENT WAS TAKING AMBIEN, BUT HAS NOW BEEN PUT ON TRAZODONE.  WANTS TO KNOW IF SHE IS TO STOP TAKING THE AMBIEN ALL TOGETHER NOW.  [phone]

## 2011-12-17 NOTE — Telephone Encounter (Signed)
Pt notified to not use Ambien

## 2012-01-05 ENCOUNTER — Telehealth: Payer: Self-pay

## 2012-01-05 MED ORDER — CLONAZEPAM 2 MG PO TABS: 4.0000 mg | ORAL_TABLET | Freq: Every evening | ORAL | Status: DC | PRN

## 2012-01-05 NOTE — Telephone Encounter (Signed)
Pt would like a refill on klonopin. Best# [phone]

## 2012-01-05 NOTE — Telephone Encounter (Signed)
Rx printed

## 2012-01-06 NOTE — Telephone Encounter (Signed)
lmom that rx was sent into pharmacy 

## 2012-01-13 ENCOUNTER — Telehealth: Payer: Self-pay

## 2012-01-13 MED ORDER — BETAMETHASONE VALERATE 0.1 % EX CREA: TOPICAL_CREAM | Freq: Two times a day (BID) | CUTANEOUS | Status: DC

## 2012-01-13 NOTE — Telephone Encounter (Signed)
Done

## 2012-01-13 NOTE — Telephone Encounter (Signed)
Pt states she is calling to change her pharmacy [facility] and is needing a rx refill on a cream betemanethasone cream. [phone]

## 2012-01-14 NOTE — Telephone Encounter (Signed)
LMOM that this was sent in 

## 2012-02-06 ENCOUNTER — Other Ambulatory Visit: Payer: Self-pay | Admitting: Physician Assistant

## 2012-02-07 ENCOUNTER — Telehealth: Payer: Self-pay

## 2012-02-07 DIAGNOSIS — G4700 Insomnia, unspecified: Secondary | ICD-10-CM

## 2012-02-07 MED ORDER — TRAZODONE HCL 100 MG PO TABS: 100.0000 mg | ORAL_TABLET | Freq: Every evening | ORAL | Status: DC | PRN

## 2012-02-07 MED ORDER — CLONAZEPAM 2 MG PO TABS: 4.0000 mg | ORAL_TABLET | Freq: Every evening | ORAL | Status: DC | PRN

## 2012-02-07 NOTE — Telephone Encounter (Signed)
Pt called and would like refill on Clonopin and Trazodone.  She states that [facility] had prescribed 50 mg of Trazodone, however she has been taking 100 mg and that seems to be working for her.  Best number for pt [phone].  [facility] on [address].

## 2012-02-07 NOTE — Telephone Encounter (Signed)
Trazodone sent.  Clonazepam printed.

## 2012-02-08 NOTE — Telephone Encounter (Signed)
Notified pt Rx were sent in.

## 2012-02-18 ENCOUNTER — Telehealth: Payer: Self-pay

## 2012-02-18 DIAGNOSIS — M545 Low back pain: Secondary | ICD-10-CM

## 2012-02-18 MED ORDER — CYCLOBENZAPRINE HCL 10 MG PO TABS: ORAL_TABLET | ORAL | Status: DC

## 2012-02-18 NOTE — Telephone Encounter (Signed)
Sent to pharmacy 

## 2012-02-18 NOTE — Telephone Encounter (Signed)
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 SHE IS OUT OF THIS AND WOULD LIKE TO KNOW IF WE COULD CALL HER IN SOME MORE OF THIS  IF POSSIBLE PLEASE CALL [phone]. THE PHARMACY IS [facility]

## 2012-02-18 NOTE — Telephone Encounter (Signed)
lmom that rx was sent into pharmacy 

## 2012-03-07 ENCOUNTER — Other Ambulatory Visit: Payer: Self-pay | Admitting: Physician Assistant

## 2012-03-08 ENCOUNTER — Other Ambulatory Visit: Payer: Self-pay

## 2012-03-12 ENCOUNTER — Other Ambulatory Visit: Payer: Self-pay | Admitting: Physician Assistant

## 2012-03-12 DIAGNOSIS — F419 Anxiety disorder, unspecified: Secondary | ICD-10-CM

## 2012-03-12 DIAGNOSIS — G4700 Insomnia, unspecified: Secondary | ICD-10-CM

## 2012-03-12 DIAGNOSIS — F329 Major depressive disorder, single episode, unspecified: Secondary | ICD-10-CM

## 2012-03-12 NOTE — Telephone Encounter (Signed)
Refill request received via fax from [facility] for sertraline and amitriptyline.  Will send this in but wanted to clarify pharmacy first as it looks patient has filled this multiple places before.  Pt will also need office visit as she was last seen in May 2013

## 2012-03-17 ENCOUNTER — Telehealth: Payer: Self-pay | Admitting: Radiology

## 2012-03-17 DIAGNOSIS — F419 Anxiety disorder, unspecified: Secondary | ICD-10-CM

## 2012-03-17 DIAGNOSIS — F329 Major depressive disorder, single episode, unspecified: Secondary | ICD-10-CM

## 2012-03-17 DIAGNOSIS — G4700 Insomnia, unspecified: Secondary | ICD-10-CM

## 2012-03-17 MED ORDER — AMITRIPTYLINE HCL 100 MG PO TABS: 100.0000 mg | ORAL_TABLET | Freq: Every day | ORAL | Status: DC

## 2012-03-17 MED ORDER — SERTRALINE HCL 100 MG PO TABS: 150.0000 mg | ORAL_TABLET | Freq: Every day | ORAL | Status: DC

## 2012-03-17 NOTE — Telephone Encounter (Signed)
    Godfrey Pick, PA-C 03/12/2012 7:55 AM Signed  Refill request received via fax from [facility] for sertraline and amitriptyline. Will send this in but wanted to clarify pharmacy first as it looks patient has filled this multiple places before. Pt will also need office visit as she was last seen in May 2013   I have sent these in/ [facility]. Patient advised she will need follow up prior to further refills.

## 2012-03-21 ENCOUNTER — Other Ambulatory Visit: Payer: Self-pay | Admitting: Physician Assistant

## 2012-04-06 ENCOUNTER — Other Ambulatory Visit: Payer: Self-pay | Admitting: Physician Assistant

## 2012-04-08 ENCOUNTER — Telehealth: Payer: Self-pay | Admitting: *Deleted

## 2012-04-08 ENCOUNTER — Other Ambulatory Visit: Payer: Self-pay | Admitting: Physician Assistant

## 2012-04-08 NOTE — Telephone Encounter (Signed)
[facility]ill on clonazepam 2mg . Last fill on 03/08/12

## 2012-04-08 NOTE — Telephone Encounter (Signed)
Rx printed.  Advise patient that I need to see her for her next fill.  Our last visit was in 07/2011.

## 2012-04-08 NOTE — Telephone Encounter (Signed)
Pt called to check status and [facility] faxed Rx to [facility] and advised pt she needs OV for add'l RFs. Pt already has appt scheduled for 05/02/12.

## 2012-04-09 ENCOUNTER — Telehealth: Payer: Self-pay

## 2012-04-09 NOTE — Telephone Encounter (Signed)
I spoke to patient she is requesting from [facility] mail order Elavil, and Zoloft. She gets from [facility] Trazodone and Klonopin. Please advise if we can renew these until her insurance becomes effective with her new job in March. I told her this sounds reasonable, but I will find out and call her back. Please advise.

## 2012-04-09 NOTE — Telephone Encounter (Signed)
Chelle:  Patient is needing to come in and see you regarding Rx refills but does not have insurance til March and see is suppose to see you in February.  Pt told me that she was told that you would not give her any more refills until she came in and see you first.  I told the patient that I would put in the message to see if she reschedules her appointment to March if you would refill her medicines.  Best#: [phone]

## 2012-04-09 NOTE — Telephone Encounter (Signed)
Please advise the patient that I authorized the clonazepam on 1/18 and the elavil and zoloft 1/20.  They may be resent to whichever pharmacy she desires.  Consider calling the pharmacy first, to see if the Rx's have been received.  If the patient desires a different pharmacy, cancel the original ones and call in to her preferred pharmacy.

## 2012-04-10 NOTE — Telephone Encounter (Signed)
Notified pt that Chelle OKd the prescription RFs and that she should have RFs on her Trazodone already at [facility]. Pt was concerned because she doesn't have ins until March, but wanted to make sure it is OK for Chelle that she waits until then. I advised pt that her plan to RTC in March is in the message that was sent to [facility], and that since she authorized the RFs I believe that Chelle is in agreement w/an OV in March.  Chelle, please advise. If you need to see pt in Feb, please let me know and I will call her back. Otherwise it is her plan to come see you when she gets her ins in March.

## 2012-04-10 NOTE — Telephone Encounter (Signed)
Yes.  Re-evaluate in [facility] 2014.  Thanks.

## 2012-04-15 ENCOUNTER — Other Ambulatory Visit: Payer: Self-pay | Admitting: Physician Assistant

## 2012-04-17 ENCOUNTER — Other Ambulatory Visit: Payer: Self-pay | Admitting: Physician Assistant

## 2012-05-02 ENCOUNTER — Ambulatory Visit: Payer: BC Managed Care – PPO | Admitting: Physician Assistant

## 2012-05-09 ENCOUNTER — Other Ambulatory Visit: Payer: Self-pay | Admitting: Physician Assistant

## 2012-05-10 ENCOUNTER — Ambulatory Visit (INDEPENDENT_AMBULATORY_CARE_PROVIDER_SITE_OTHER): Payer: BC Managed Care – PPO | Admitting: Physician Assistant

## 2012-05-10 VITALS — BP 156/88 | HR 94 | Temp 97.8°F | Resp 16 | Ht 69.5 in | Wt 211.0 lb

## 2012-05-10 DIAGNOSIS — F341 Dysthymic disorder: Secondary | ICD-10-CM

## 2012-05-10 DIAGNOSIS — F419 Anxiety disorder, unspecified: Secondary | ICD-10-CM | POA: Insufficient documentation

## 2012-05-10 DIAGNOSIS — M549 Dorsalgia, unspecified: Secondary | ICD-10-CM

## 2012-05-10 DIAGNOSIS — A630 Anogenital (venereal) warts: Secondary | ICD-10-CM | POA: Insufficient documentation

## 2012-05-10 DIAGNOSIS — G4700 Insomnia, unspecified: Secondary | ICD-10-CM

## 2012-05-10 DIAGNOSIS — F329 Major depressive disorder, single episode, unspecified: Secondary | ICD-10-CM | POA: Insufficient documentation

## 2012-05-10 DIAGNOSIS — B20 Human immunodeficiency virus [HIV] disease: Secondary | ICD-10-CM | POA: Insufficient documentation

## 2012-05-10 DIAGNOSIS — G8929 Other chronic pain: Secondary | ICD-10-CM

## 2012-05-10 MED ORDER — MELOXICAM 15 MG PO TABS: 7.5000 mg | ORAL_TABLET | Freq: Every day | ORAL | Status: DC

## 2012-05-10 MED ORDER — CYCLOBENZAPRINE HCL 10 MG PO TABS: 10.0000 mg | ORAL_TABLET | Freq: Three times a day (TID) | ORAL | Status: DC | PRN

## 2012-05-10 MED ORDER — TRAZODONE HCL 100 MG PO TABS: 200.0000 mg | ORAL_TABLET | Freq: Every day | ORAL | Status: DC

## 2012-05-10 NOTE — Progress Notes (Signed)
Subjective:    Patient ID: Tracy Andrade, female    DOB: 02/10/1961, 52 y.o.   MRN: [id number]  HPI This 52 y.o. female presents for evaluation of chronic insomnia and depression/anxiety.  Last seen here in 07/2011, due to losing her FT job and with it, her health insurance coverage.  She now has another full time position that she really likes and has regained her insurance.  She continues to receive care from the ID clinic at [facility].  She's scheduled to have a colonoscopy next month.   Past Medical History  Diagnosis Date  . Arthritis     Past Surgical History  Procedure Laterality Date  . Abdominal hysterectomy      Prior to Admission medications   Medication Sig Start Date End Date Taking? Authorizing Provider  amitriptyline (ELAVIL) 100 MG tablet TAKE ONE TABLET BY MOUTH AT BEDTIME 04/08/12  Yes Rumi Taras S Etta Gassett, PA-C  betamethasone valerate (VALISONE) 0.1 % cream APPLY TOPICALLY TWO TIMES DAILY  **NEEDS OFFICE VISIT** 04/17/12  Yes Ryan M Dunn, PA-C  cyclobenzaprine (FLEXERIL) 10 MG tablet TAKE ONE-HALF TO ONE TABLET BY MOUTH THREE TIMES DAILY AS NEEDED 04/15/12  Yes Ryan M Dunn, PA-C  emtricitabine-tenofovir (TRUVADA) 200-300 MG per tablet Take 1 tablet by mouth daily.   Yes Historical Provider, MD  Little Ishikawa 200-50 MG per tablet  06/18/11  Yes Historical Provider, MD  raltegravir (ISENTRESS) 400 MG tablet Take 400 mg by mouth 2 (two) times daily.   Yes Historical Provider, MD  traZODone (DESYREL) 100 MG tablet Take 1 tablet (100 mg total) by mouth at bedtime as needed for sleep. 02/07/12  Yes Jalena Vanderlinden S Laurell Coalson, PA-C  clonazePAM (KLONOPIN) 2 MG tablet TAKE TWO TABLETS BY MOUTH AT BEDTIME AS NEEDED AND  1  TABLET  DURING  THE  DAY  AS  NEEDED 05/09/12   Keysha Damewood S Breniyah Romm, PA-C  diclofenac sodium (VOLTAREN) 1 % GEL Apply 1 application topically 4 (four) times daily - after meals and at bedtime. 08/01/11   Otho Darner, MD  meloxicam (MOBIC) 15 MG tablet TAKE 1/2 TO 1 TABLET BY MOUTH  EVERY DAY 08/15/11   Lessie Funderburke S Keia Rask, PA-C  sertraline (ZOLOFT) 100 MG tablet TAKE ONE AND A HALF TABLET BY MOUTH DAILY 04/08/12   Kahdijah Errickson S Fouad Taul, PA-C  zolpidem (AMBIEN) 10 MG tablet Take 1 tablet (10 mg total) by mouth at bedtime as needed for sleep. 12/08/11   Sondra Barges, PA-C    No Known Allergies  History   Social History  . Marital Status: Single    Spouse Name: n/a    Number of Children: 1  . Years of Education: N/A   Occupational History  . 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 Smoking status: Former Games developer  . Smokeless tobacco: Not on file  . Alcohol Use: No  . Drug Use: No  . Sexually Active: Yes    Birth Control/ Protection: Condom   Other Topics Concern  . Not on file   Social History Narrative   Her adult son, who was living with her, has moved home to [place]. Her best friend, who also lived with her briefly, has moved back to [place] after a falling out over the friend's unwillingness to share in the rent.    Family History  Problem Relation Age of Onset  . Cancer Maternal Grandmother   . Hypertension Maternal Grandfather  Review of Systems As above.  No chest pain, SOB, HA, dizziness, vision change, N/V, diarrhea, constipation, dysuria, urinary urgency or frequency, or rash. Back pain persists with long standing and with lifting.     Objective:   Physical Exam  Blood pressure 156/88, pulse 94, temperature 97.8 F (36.6 C), resp. rate 16, height 5' 9.5" (1.765 m), weight 211 lb (95.709 kg). Body mass index is 30.72 kg/(m^2). Well-developed, well nourished BF who is awake, alert and oriented, in NAD. HEENT: /AT, sclera and conjunctiva are clear.   Neck: supple, non-tender, no lymphadenopathy, thyromegaly. Heart: RRR, no murmur Lungs: normal effort, CTA Extremities: no cyanosis, clubbing or edema. Skin: warm and dry without rash. Psychologic: good mood and appropriate affect, normal  speech and behavior.  Recent labs from [facility] reviewed via Care Everywhere.    Assessment & Plan:  Insomnia - currently controlled/stable. Plan: traZODone (DESYREL) 100 MG tablet  Anxiety and depression-stable/improved.  Continue current treatment.  Chronic back pain - stable. Plan: cyclobenzaprine (FLEXERIL) 10 MG tablet, meloxicam (MOBIC) 15 MG tablet  Encouraged daily exercise with focus on core strengthening and body mechanics.

## 2012-05-11 ENCOUNTER — Encounter: Payer: Self-pay | Admitting: Physician Assistant

## 2012-05-30 ENCOUNTER — Ambulatory Visit: Payer: BC Managed Care – PPO | Admitting: Physician Assistant

## 2012-06-06 ENCOUNTER — Other Ambulatory Visit: Payer: Self-pay | Admitting: Physician Assistant

## 2012-06-07 ENCOUNTER — Other Ambulatory Visit: Payer: Self-pay | Admitting: Radiology

## 2012-06-07 NOTE — Telephone Encounter (Signed)
Rx faxed to [facility] [place].  

## 2012-07-08 ENCOUNTER — Other Ambulatory Visit: Payer: Self-pay | Admitting: Physician Assistant

## 2012-07-10 ENCOUNTER — Other Ambulatory Visit: Payer: Self-pay | Admitting: Physician Assistant

## 2012-07-10 NOTE — Telephone Encounter (Signed)
Called in RF 

## 2012-07-10 NOTE — Telephone Encounter (Signed)
I can print this tomorrow, or it can be called in on my behalf today. 

## 2012-07-11 ENCOUNTER — Telehealth: Payer: Self-pay

## 2012-07-11 ENCOUNTER — Other Ambulatory Visit: Payer: Self-pay | Admitting: Radiology

## 2012-07-11 DIAGNOSIS — F329 Major depressive disorder, single episode, unspecified: Secondary | ICD-10-CM

## 2012-07-11 DIAGNOSIS — F419 Anxiety disorder, unspecified: Secondary | ICD-10-CM

## 2012-07-11 MED ORDER — SERTRALINE HCL 100 MG PO TABS: 150.0000 mg | ORAL_TABLET | Freq: Every day | ORAL | Status: DC

## 2012-07-11 MED ORDER — AMITRIPTYLINE HCL 100 MG PO TABS: ORAL_TABLET | ORAL | Status: DC

## 2012-07-11 NOTE — Telephone Encounter (Signed)
Pt has a cracked toe nail for awhile now and she has been putting acrylic over her toe nail and she has recently stumped it and he still has not healed all the way, she said its not hurting but she wants to know if something could be called in for her or if she needs to come.  Call back number is [phone]

## 2012-07-11 NOTE — Telephone Encounter (Signed)
Returned pt call- Left message for pt to come to office for evaluation we can not prescribe anything without seeing the pt first.

## 2012-07-11 NOTE — Telephone Encounter (Signed)
Also request for her sertraline was sent. Please advise

## 2012-07-11 NOTE — Telephone Encounter (Signed)
[facility] has sent fax for patients Amitriptyline please advise. pended

## 2012-07-15 ENCOUNTER — Other Ambulatory Visit: Payer: Self-pay | Admitting: Physician Assistant

## 2012-07-24 ENCOUNTER — Ambulatory Visit: Payer: BC Managed Care – PPO | Admitting: Podiatry

## 2012-08-07 ENCOUNTER — Other Ambulatory Visit: Payer: Self-pay

## 2012-08-07 DIAGNOSIS — G4700 Insomnia, unspecified: Secondary | ICD-10-CM

## 2012-08-07 MED ORDER — TRAZODONE HCL 100 MG PO TABS: 200.0000 mg | ORAL_TABLET | Freq: Every day | ORAL | Status: DC

## 2012-08-08 ENCOUNTER — Other Ambulatory Visit: Payer: Self-pay | Admitting: Physician Assistant

## 2012-08-09 ENCOUNTER — Other Ambulatory Visit: Payer: Self-pay | Admitting: Physician Assistant

## 2012-09-09 ENCOUNTER — Other Ambulatory Visit: Payer: Self-pay | Admitting: Physician Assistant

## 2012-09-10 ENCOUNTER — Other Ambulatory Visit: Payer: Self-pay | Admitting: Radiology

## 2012-10-10 ENCOUNTER — Other Ambulatory Visit: Payer: Self-pay | Admitting: Physician Assistant

## 2012-10-10 ENCOUNTER — Telehealth: Payer: Self-pay | Admitting: Physician Assistant

## 2012-10-11 ENCOUNTER — Other Ambulatory Visit: Payer: Self-pay | Admitting: Radiology

## 2012-10-11 NOTE — Telephone Encounter (Signed)
Please advise on fax from [facility] mail order for Elavil and Zoloft

## 2012-10-14 ENCOUNTER — Telehealth: Payer: Self-pay | Admitting: Radiology

## 2012-10-14 MED ORDER — AMITRIPTYLINE HCL 100 MG PO TABS: ORAL_TABLET | ORAL | Status: DC

## 2012-10-14 MED ORDER — SERTRALINE HCL 100 MG PO TABS: ORAL_TABLET | ORAL | Status: DC

## 2012-10-14 NOTE — Telephone Encounter (Signed)
Patient called again about Klonopin refill. Patient states she does not have insurance and is currently using [employer].  [phone]

## 2012-10-14 NOTE — Telephone Encounter (Signed)
Patient called again about the Klonopin [phone] please advise on refill

## 2012-10-14 NOTE — Telephone Encounter (Signed)
Patient notified and voiced understanding. Advised her since [facility] was already closed I would call it in tomorrow morning.

## 2012-10-14 NOTE — Telephone Encounter (Signed)
Rx printed and signed.  

## 2012-10-15 ENCOUNTER — Telehealth: Payer: Self-pay

## 2012-10-15 MED ORDER — AMITRIPTYLINE HCL 100 MG PO TABS: ORAL_TABLET | ORAL | Status: DC

## 2012-10-15 NOTE — Telephone Encounter (Signed)
Pt is needing to talk with someone about her medication klonapin and the amyitripiline and [facility]  Best number [phone]

## 2012-10-15 NOTE — Telephone Encounter (Signed)
Resent

## 2012-10-15 NOTE — Telephone Encounter (Signed)
Revonda Standard is working on this.

## 2012-10-15 NOTE — Telephone Encounter (Signed)
Patient would like amitriptyline (ELAVIL) 100 MG tablet [[id number]] sent to [facility]. There was [facility], patient says that was a mistake and should have gone to [facility].

## 2012-11-10 ENCOUNTER — Other Ambulatory Visit: Payer: Self-pay | Admitting: Physician Assistant

## 2012-11-13 ENCOUNTER — Telehealth: Payer: Self-pay

## 2012-11-13 MED ORDER — CLONAZEPAM 2 MG PO TABS: ORAL_TABLET | ORAL | Status: DC

## 2012-11-13 NOTE — Telephone Encounter (Signed)
Rx printed.  Meds ordered this encounter  Medications  . clonazePAM (KLONOPIN) 2 MG tablet    Sig: TAKE TWO TABLETS BY MOUTH AT BEDTIME AS NEEDED AND  1  TABLET  DURING  THE  DAY  AS  NEEDED    Dispense:  90 tablet    Refill:  0    Order Specific Question:  Supervising Provider    Answer:  DOOLITTLE, ROBERT P [[id number]]

## 2012-11-13 NOTE — Telephone Encounter (Signed)
faxed

## 2012-11-13 NOTE — Telephone Encounter (Signed)
Pharm reqs RF of Klonopin 2 mg.

## 2012-12-09 ENCOUNTER — Other Ambulatory Visit: Payer: Self-pay | Admitting: Physician Assistant

## 2012-12-10 ENCOUNTER — Other Ambulatory Visit: Payer: Self-pay | Admitting: Physician Assistant

## 2012-12-10 ENCOUNTER — Telehealth: Payer: Self-pay | Admitting: *Deleted

## 2012-12-10 MED ORDER — CLONAZEPAM 2 MG PO TABS: ORAL_TABLET | ORAL | Status: DC

## 2012-12-10 NOTE — Telephone Encounter (Signed)
Faxed rx to pharmacy  

## 2012-12-21 ENCOUNTER — Other Ambulatory Visit: Payer: Self-pay | Admitting: Physician Assistant

## 2013-01-06 ENCOUNTER — Other Ambulatory Visit: Payer: Self-pay | Admitting: Physician Assistant

## 2013-01-07 ENCOUNTER — Other Ambulatory Visit: Payer: Self-pay | Admitting: Radiology

## 2013-01-08 ENCOUNTER — Other Ambulatory Visit: Payer: Self-pay | Admitting: Physician Assistant

## 2013-01-08 ENCOUNTER — Telehealth: Payer: Self-pay

## 2013-01-08 ENCOUNTER — Other Ambulatory Visit: Payer: Self-pay

## 2013-01-08 MED ORDER — AMITRIPTYLINE HCL 100 MG PO TABS: ORAL_TABLET | ORAL | Status: DC

## 2013-01-08 NOTE — Telephone Encounter (Signed)
Pended please advise.  

## 2013-01-08 NOTE — Telephone Encounter (Signed)
Patient [facility] [address].   [phone]

## 2013-01-08 NOTE — Telephone Encounter (Signed)
Rx request authorized from Rx refill pool, so I've deleted the order pended in this phone message.

## 2013-01-12 ENCOUNTER — Other Ambulatory Visit: Payer: Self-pay | Admitting: Physician Assistant

## 2013-01-17 ENCOUNTER — Telehealth: Payer: Self-pay | Admitting: Student

## 2013-01-17 ENCOUNTER — Ambulatory Visit (INDEPENDENT_AMBULATORY_CARE_PROVIDER_SITE_OTHER): Payer: BC Managed Care – PPO | Admitting: Physician Assistant

## 2013-01-17 VITALS — BP 120/80 | HR 76 | Temp 97.9°F | Resp 16 | Ht 69.5 in | Wt 207.0 lb

## 2013-01-17 DIAGNOSIS — K219 Gastro-esophageal reflux disease without esophagitis: Secondary | ICD-10-CM

## 2013-01-17 DIAGNOSIS — G8929 Other chronic pain: Secondary | ICD-10-CM

## 2013-01-17 DIAGNOSIS — M549 Dorsalgia, unspecified: Secondary | ICD-10-CM

## 2013-01-17 DIAGNOSIS — L601 Onycholysis: Secondary | ICD-10-CM

## 2013-01-17 DIAGNOSIS — F329 Major depressive disorder, single episode, unspecified: Secondary | ICD-10-CM

## 2013-01-17 DIAGNOSIS — F32A Depression, unspecified: Secondary | ICD-10-CM

## 2013-01-17 DIAGNOSIS — Z23 Encounter for immunization: Secondary | ICD-10-CM

## 2013-01-17 DIAGNOSIS — L608 Other nail disorders: Secondary | ICD-10-CM

## 2013-01-17 DIAGNOSIS — F341 Dysthymic disorder: Secondary | ICD-10-CM

## 2013-01-17 MED ORDER — TRAMADOL HCL 50 MG PO TABS: 50.0000 mg | ORAL_TABLET | Freq: Three times a day (TID) | ORAL | Status: DC | PRN

## 2013-01-17 MED ORDER — CYCLOBENZAPRINE HCL 10 MG PO TABS: 10.0000 mg | ORAL_TABLET | Freq: Three times a day (TID) | ORAL | Status: DC | PRN

## 2013-01-17 NOTE — Progress Notes (Signed)
Procedural Note: left great toe  Verbal Consent Obtained Local anesthesia with 5 cc with 2% plain lidocaine digital block of left great toe Sterile prep and drape Nail lifted and removed en toto Granulomatous tissue cut at its base just distal to proximal nail fold Xeroform placed Cleansed and dressed

## 2013-01-17 NOTE — Progress Notes (Signed)
  Subjective:    Patient ID: Tracy Andrade, female    DOB: [birth year], 52 y.o.   MRN: [id number]  HPI  Ms. Tracy Andrade is a 52 YO female with a history of HIV, anxiety, depression, chronic back pain and GERD, last seen here on 05/11/12 who presents today for follow-up of these problems.   She currently is taking Zoloft, Amitriptyline, and Klonopin for her anxiety and depression and states that these problems have been stable. She states that she does have troubles sleeping entirely through the night occasionally but she attributes this to working multiple night shifts and frequently changing her sleep schedule due to her work.    The patient has noticed a raised pink mobile growth growing on the right side of her left great toe nail for the past year that causes no pain.  This growth is not bothering her in anyway except for its appearance.  She states that she may have injured her toe over a year ago before this growth occurred.    Ms. 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 She is currently taking Flexeril which gives her moderate relief, but states that Tramadol has also worked well in the past but is currently not taking this.   Ms. Tracy Andrade continues to receive care from the ID clinic at [facility] and states her Viral load is well controlled at this time.    Patient Active Problem List   Diagnosis Date Noted  . HIV disease 05/10/2012  . Insomnia 05/10/2012  . Anxiety and depression 05/10/2012  . Chronic back pain 05/10/2012  . Condyloma acuminata 05/10/2012  . 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 GERD (gastroesophageal reflux disease) 02/02/2011     Review of Systems  Constitutional: Negative for fever, chills and fatigue.  Respiratory: Negative for shortness of breath.   Cardiovascular: Negative for chest pain and leg swelling.  Gastrointestinal: Negative for abdominal pain.  Musculoskeletal:  Positive for arthralgias, back pain and myalgias.  Neurological: Negative for weakness.      Objective:   Physical Exam Ms. Tracy Andrade  is a pleasant well-developed, well-nourished African American female in no acute distress Heart: Normal rate and rhythm, no murmurs, rubs, or gallops Lungs: Clear to auscultation bilaterally, no wheezing, rhonchi, or rales Extremities: No cyanosis or edema noted.  Normo-pigmented raised non-tender growth on the medial aspect of the left great toenail replacing the medial half of toenail.   BP 120/80  Pulse 76  Temp(Src) 97.9 F (36.6 C) (Oral)  Resp 16  Ht 5' 9.5" (1.765 m)  Wt 207 lb (93.895 kg)  BMI 30.14 kg/m2  SpO2 96%     Assessment & Plan:  Anxiety and depression - continue current treatment.  May call for refills, RTC 6 months  Chronic back pain - Plan: cyclobenzaprine (FLEXERIL) 10 MG tablet, traMADol (ULTRAM) 50 MG tablet  Need for influenza vaccination - Plan: Flu Vaccine QUAD 36+ mos IM  Onycholysis of toenail-Removed: see procedure note  GERD (gastroesophageal reflux disease)-Will determine previous medication used in past and send order for this medication.

## 2013-01-17 NOTE — Telephone Encounter (Signed)
Patient called.  Forgot to get prescription for GERD while patient was in office.  Was unable to determine previous medication used to treat reflux and inquiring if patient remembers which medication this was or if there any record she has of this medication.  If unknown by patient, willing to prescribe a recommended medication for reflux.   Also noted in patient's chart a history of elevated blood sugar in January 2013 and therefore would like the patient to come back to the office within the next 3 months to do some blood work while she still has medical insurance to cover this.

## 2013-01-17 NOTE — Progress Notes (Signed)
I have examined this patient along with the student and agree. I directly supervised and participated in the procedure and agree with the student's documentation.  The patient requested a refill of medication previously given for GERD, but we found none listed in her record. Called her, asking that she call back to discuss-happy to Rx PPI or H2 blocker.  Additionally, in reviewing her record, discovered history of elevated glucose and A1C.  That was noted at a time that she did not have insurance and she wasn't following up regularly.  ASked that she return in the next 3 months to update labs and determine if she needs to be on any additional agents.

## 2013-01-24 ENCOUNTER — Encounter: Payer: Self-pay | Admitting: Physician Assistant

## 2013-01-24 DIAGNOSIS — R739 Hyperglycemia, unspecified: Secondary | ICD-10-CM | POA: Insufficient documentation

## 2013-01-24 DIAGNOSIS — N893 Dysplasia of vagina, unspecified: Secondary | ICD-10-CM

## 2013-01-24 DIAGNOSIS — A630 Anogenital (venereal) warts: Secondary | ICD-10-CM

## 2013-01-24 DIAGNOSIS — L309 Dermatitis, unspecified: Secondary | ICD-10-CM

## 2013-01-24 DIAGNOSIS — N393 Stress incontinence (female) (male): Secondary | ICD-10-CM

## 2013-01-24 DIAGNOSIS — Z780 Asymptomatic menopausal state: Secondary | ICD-10-CM | POA: Insufficient documentation

## 2013-01-24 DIAGNOSIS — B20 Human immunodeficiency virus [HIV] disease: Secondary | ICD-10-CM

## 2013-02-05 ENCOUNTER — Other Ambulatory Visit: Payer: Self-pay | Admitting: Physician Assistant

## 2013-02-06 ENCOUNTER — Other Ambulatory Visit: Payer: Self-pay | Admitting: Radiology

## 2013-02-09 ENCOUNTER — Other Ambulatory Visit: Payer: Self-pay | Admitting: Physician Assistant

## 2013-03-08 ENCOUNTER — Other Ambulatory Visit: Payer: Self-pay | Admitting: Physician Assistant

## 2013-03-25 ENCOUNTER — Other Ambulatory Visit: Payer: Self-pay | Admitting: Physician Assistant

## 2013-03-26 NOTE — Telephone Encounter (Signed)
Last OV states ok to refill medications for 6 months, this one was not sent. Ok to refill?

## 2013-04-07 ENCOUNTER — Other Ambulatory Visit: Payer: Self-pay | Admitting: Physician Assistant

## 2013-04-07 ENCOUNTER — Telehealth: Payer: Self-pay

## 2013-04-07 MED ORDER — TRAZODONE HCL 100 MG PO TABS
ORAL_TABLET | ORAL | Status: DC
Start: 2013-04-07 — End: 2013-06-24

## 2013-04-07 NOTE — Telephone Encounter (Signed)
Pt called to report that RF had been approved for trazadone, but only for 1 Qhs and she takes 2 Qhs. I have checked records and pt has most recently been taking 2 tabs Qhs. I am resending corrected Rx. Notified pt.

## 2013-04-21 ENCOUNTER — Other Ambulatory Visit: Payer: Self-pay | Admitting: Physician Assistant

## 2013-04-22 NOTE — Telephone Encounter (Signed)
faxed

## 2013-05-20 ENCOUNTER — Other Ambulatory Visit: Payer: Self-pay | Admitting: Internal Medicine

## 2013-05-22 ENCOUNTER — Other Ambulatory Visit: Payer: Self-pay | Admitting: Physician Assistant

## 2013-06-21 ENCOUNTER — Other Ambulatory Visit: Payer: Self-pay | Admitting: Physician Assistant

## 2013-06-23 NOTE — Telephone Encounter (Signed)
Please notify patient that she needs to come in to see me for her next fill.  Rx printed.  Meds ordered this encounter  Medications  . clonazePAM (KLONOPIN) 2 MG tablet    Sig: TAKE TWO TABLETS BY MOUTH AT BEDTIME AND  1  TABLET  DURING  THE  DAY  AS  NEEDED    Dispense:  90 tablet    Refill:  0

## 2013-06-24 ENCOUNTER — Other Ambulatory Visit: Payer: Self-pay | Admitting: Physician Assistant

## 2013-06-24 NOTE — Telephone Encounter (Signed)
Pt notified and rx called into pharm

## 2013-06-24 NOTE — Telephone Encounter (Signed)
Pt needs trazadone too please

## 2013-06-25 NOTE — Telephone Encounter (Signed)
Chelle approved RF of clonazepam on 06/21/13 req. I sent in RF of trazodone and notified pt.

## 2013-06-26 ENCOUNTER — Other Ambulatory Visit: Payer: Self-pay | Admitting: Physician Assistant

## 2013-07-08 ENCOUNTER — Ambulatory Visit: Payer: BC Managed Care – PPO | Admitting: Physician Assistant

## 2013-07-12 ENCOUNTER — Ambulatory Visit (INDEPENDENT_AMBULATORY_CARE_PROVIDER_SITE_OTHER): Payer: No Typology Code available for payment source | Admitting: Physician Assistant

## 2013-07-12 ENCOUNTER — Ambulatory Visit: Payer: No Typology Code available for payment source

## 2013-07-12 VITALS — BP 140/68 | HR 95 | Temp 98.2°F | Resp 16 | Ht 69.5 in | Wt 209.8 lb

## 2013-07-12 DIAGNOSIS — K5900 Constipation, unspecified: Secondary | ICD-10-CM

## 2013-07-12 DIAGNOSIS — M549 Dorsalgia, unspecified: Principal | ICD-10-CM

## 2013-07-12 DIAGNOSIS — F341 Dysthymic disorder: Secondary | ICD-10-CM

## 2013-07-12 DIAGNOSIS — E669 Obesity, unspecified: Secondary | ICD-10-CM | POA: Insufficient documentation

## 2013-07-12 DIAGNOSIS — F329 Major depressive disorder, single episode, unspecified: Secondary | ICD-10-CM

## 2013-07-12 DIAGNOSIS — G8929 Other chronic pain: Secondary | ICD-10-CM

## 2013-07-12 DIAGNOSIS — F419 Anxiety disorder, unspecified: Principal | ICD-10-CM

## 2013-07-12 MED ORDER — TRAMADOL HCL 50 MG PO TABS: 50.0000 mg | ORAL_TABLET | Freq: Three times a day (TID) | ORAL | Status: DC | PRN

## 2013-07-12 MED ORDER — BUPROPION HCL ER (XL) 150 MG PO TB24: 150.0000 mg | ORAL_TABLET | Freq: Every day | ORAL | Status: DC

## 2013-07-12 NOTE — Patient Instructions (Addendum)
Purchase OTC Miralax. Take 1 dose daily. You need to drink 64 ounces of water daily.  You can try black cohosh for hot flashes.  Serotonin Syndrome Serotonin is a brain chemical that regulates the nervous system. Some kinds of drugs increase the amount of serotonin in your body. Drugs that increase the serotonin in your body include:   Anti-depressant medications.  St. John's wort.  Recreational drugs.  Migraine medicines.  Some pain medicines. SYMPTOMS Combining these drugs increases the risk that you will become ill with a toxic condition called serotonin syndrome.  Symptoms of too much serotonin include:  Confusion.  Agitation.  Weakness.  Insomnia.  Fever.  Sweats. Other symptoms that may develop include:  Shakiness.  Muscle spasms.  Seizures. TREATMENT  Hospital treatment is often needed until the effects are controlled.  Avoiding the combination of medicines listed above is recommended.  Check with your doctor if you are concerned about your medicine or the side effects. Document Released: 04/13/2004 Document Revised: 05/29/2011 Document Reviewed: 03/06/2005 [facility] Patient Information 2014 [employer] [place].

## 2013-07-12 NOTE — Progress Notes (Signed)
Subjective:    Patient ID: Tracy Andrade, female    DOB: 08/21/1960, 53 y.o.   MRN: [id number]  Back Pain    53y.o female presents wanting to discuss continued use of tramadol for her LBP.  Pt also states that the flexeril is not offering adequate relief.   Pt describes pain coming and going in no particular pattern.  Pain is in the middle of the lower back, but does not radiate to lower extremities.  Feels like a spasm in character.  Also, she is no longer able to vacuum, clean, or stand for extended period of time without having to sit and rest due to pain.  She has never had imaging, PT, or orthopedic referral for lower back.  Denies personal cancer hx, saddle anesthesia, urinary changes, bowel incontinence, IV drug use, new numbness/weakness in LE.  Review of Systems  Constitutional: Negative.   HENT: Negative.   Eyes: Negative.   Respiratory: Negative for cough, chest tightness, shortness of breath and wheezing.   Cardiovascular: Negative.   Gastrointestinal: Negative.   Endocrine: Negative.   Genitourinary: Negative.   Musculoskeletal: Positive for back pain. Negative for gait problem.  Skin: Negative for rash.  Allergic/Immunologic: Negative.   Neurological: Negative.        Objective:   Physical Exam  Constitutional: She is oriented to person, place, and time. She appears well-developed and well-nourished. No distress.  BP 140/68  Pulse 95  Temp(Src) 98.2 F (36.8 C) (Oral)  Resp 16  Ht 5' 9.5" (1.765 m)  Wt 209 lb 12.8 oz (95.165 kg)  BMI 30.55 kg/m2  SpO2 97%   HENT:  Head: Normocephalic.  Eyes: Conjunctivae are normal. Pupils are equal, round, and reactive to light.  Neck: Normal range of motion.  Cardiovascular: Normal rate, regular rhythm, normal heart sounds and intact distal pulses.  Exam reveals no gallop and no friction rub.   No murmur heard. Pulmonary/Chest: Effort normal and breath sounds normal. No respiratory distress. She has no wheezes.    Musculoskeletal:       Lumbar back: She exhibits tenderness.  Tenderness to palpation of L paraspinal musculature of lower back.   Lymphadenopathy:    She has no cervical adenopathy.  Neurological: She is alert and oriented to person, place, and time. She has normal strength and normal reflexes. No sensory deficit. Coordination and gait normal.  Skin: Skin is warm and dry. No rash noted.  Psychiatric: She has a normal mood and affect. Her behavior is normal.    Lumbar spine plain film reviewed by Dr. Neva SeatGreene:  Mild spondylosis of L4-5;  No acute changes.      Assessment & Plan:   1. Anxiety and depression Still not well controlled.  Trouble sleeping despite multiple medications.  Try addition of wellbutrin.  Re-eval in 1 month.  Counseled regarding serotonin syndrome given the amount of medications that can cause.  Specifically if hot flashes or insomnia worsens, pt will stop wellbutrin and seek medical attention. - buPROPion (WELLBUTRIN XL) 150 MG 24 hr tablet; Take 1 tablet (150 mg total) by mouth daily.  Dispense: 30 tablet; Refill: 3  2. Chronic back pain Mild spondylosis L4-5 seen on lumbar spine film.  Referred to PT for worsening LBP.   - DG Lumbar Spine Complete; Future - traMADol (ULTRAM) 50 MG tablet; Take 1 tablet (50 mg total) by mouth every 8 (eight) hours as needed.  Dispense: 30 tablet; Refill: 0 - Ambulatory referral to Physical Therapy  3.  Constipation Can be contributing to back pain.  Recommended miralax.

## 2013-07-13 NOTE — Progress Notes (Signed)
I have examined this patient along with the student and agree.  

## 2013-07-22 ENCOUNTER — Other Ambulatory Visit: Payer: Self-pay | Admitting: Physician Assistant

## 2013-07-22 ENCOUNTER — Telehealth: Payer: Self-pay

## 2013-07-22 MED ORDER — TRAZODONE HCL 100 MG PO TABS: ORAL_TABLET | ORAL | Status: DC

## 2013-07-22 NOTE — Telephone Encounter (Signed)
[facility] mail order called to request RFs of trazadone for pt. Gave VO for RFs and entered into EPIC.

## 2013-07-23 NOTE — Telephone Encounter (Signed)
faxed

## 2013-07-23 NOTE — Telephone Encounter (Signed)
Faxed alprazolam. 

## 2013-07-23 NOTE — Telephone Encounter (Signed)
Chelle, wanted to check dose. At last OV 1 tab QD was listed on med list "at beginning of encounter", but "take 200 mgs was on list at end of encounter. I don't see any notes that you wanted to inc dose of this though, only that adding wellbutrin. Please advise.

## 2013-08-01 ENCOUNTER — Other Ambulatory Visit: Payer: Self-pay | Admitting: Physician Assistant

## 2013-08-03 NOTE — Telephone Encounter (Signed)
Chelle, pt also left a form with us for you to sign for her job. This is in your box

## 2013-08-04 ENCOUNTER — Encounter: Payer: Self-pay | Admitting: Physician Assistant

## 2013-08-04 ENCOUNTER — Telehealth: Payer: Self-pay | Admitting: Family Medicine

## 2013-08-04 NOTE — Telephone Encounter (Signed)
Left message on patients machine to call back for clarification for the forms she is requesting be completed to work with no restrictions.

## 2013-08-04 NOTE — Telephone Encounter (Signed)
See other phone message  

## 2013-08-04 NOTE — Progress Notes (Signed)
Patient brought in a form for a new job, asking that I sign that she does not have any limitations that would prevent her from doing the job.  The form indicates that she will be required to lift 50 pounds repeatedly throughout the day.  Given her chronic back pain, I'm not sure we should be certifying her for it.  Please call her to clarify.

## 2013-08-04 NOTE — Telephone Encounter (Signed)
Faxed Rx. See documentation message about form.

## 2013-08-04 NOTE — Progress Notes (Signed)
Spoke with pt. 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 She will not be required to do any heavy lifting

## 2013-08-05 NOTE — Progress Notes (Signed)
Form signed.  Please copy it for scanning and let the patient know that she can pick up the original at her convenience.

## 2013-08-05 NOTE — Progress Notes (Signed)
Pt has picked this up.

## 2013-08-20 ENCOUNTER — Other Ambulatory Visit: Payer: Self-pay | Admitting: Physician Assistant

## 2013-08-25 ENCOUNTER — Other Ambulatory Visit: Payer: Self-pay | Admitting: Physician Assistant

## 2013-08-25 DIAGNOSIS — F411 Generalized anxiety disorder: Secondary | ICD-10-CM

## 2013-08-25 NOTE — Telephone Encounter (Signed)
Patient is currently taking trazodone for sleep and Wellbutrin during the day. Patient is not currently taking clonazepam and has not had this filled since 04/2013. Why does she want to change?

## 2013-08-26 NOTE — Telephone Encounter (Signed)
Left message on machine to call back  

## 2013-08-26 NOTE — Telephone Encounter (Signed)
[place] database reviewed. Patient taking clonazepam long term. Refilled for Chelle.

## 2013-08-26 NOTE — Telephone Encounter (Signed)
Pt does take this. We last RF'ed 07/22/13

## 2013-08-27 NOTE — Telephone Encounter (Signed)
Faxed

## 2013-09-15 ENCOUNTER — Other Ambulatory Visit: Payer: Self-pay | Admitting: Physician Assistant

## 2013-09-16 NOTE — Telephone Encounter (Signed)
faxed

## 2013-09-27 ENCOUNTER — Other Ambulatory Visit: Payer: Self-pay | Admitting: Family Medicine

## 2013-09-30 NOTE — Telephone Encounter (Signed)
Patient is calling to follow up on her request from 09/27/13 for clonazePAM (KLONOPIN) 2 MG tablet   [phone]

## 2013-10-01 NOTE — Telephone Encounter (Signed)
Faxed to [facility]. LMOM letting pt know

## 2013-10-20 ENCOUNTER — Other Ambulatory Visit: Payer: Self-pay | Admitting: Physician Assistant

## 2013-11-03 ENCOUNTER — Other Ambulatory Visit: Payer: Self-pay | Admitting: Physician Assistant

## 2013-11-06 NOTE — Telephone Encounter (Signed)
Called in.

## 2013-11-18 ENCOUNTER — Other Ambulatory Visit: Payer: Self-pay | Admitting: Physician Assistant

## 2013-11-19 ENCOUNTER — Telehealth: Payer: Self-pay

## 2013-11-19 NOTE — Telephone Encounter (Signed)
Faxed

## 2013-11-19 NOTE — Telephone Encounter (Signed)
Patient is calling to inquire about her tramadol refill request that her pharmacy was to send. CB # [phone]

## 2013-12-10 ENCOUNTER — Other Ambulatory Visit: Payer: Self-pay | Admitting: Physician Assistant

## 2013-12-12 NOTE — Telephone Encounter (Signed)
Received fax from pharm also requesting RF. Pending review from Chelle.

## 2013-12-13 ENCOUNTER — Telehealth: Payer: Self-pay

## 2013-12-13 NOTE — Telephone Encounter (Signed)
Pt states [facility] sent refill for Klonopin Thursday and shes not heard back. No record in pts medication list. Pt upset at this because shes been out for days now.  Pt states she will follow up with [facility] because they said they sent it in.   Please call pt.   bf

## 2013-12-15 NOTE — Telephone Encounter (Signed)
Faxed

## 2013-12-15 NOTE — Telephone Encounter (Signed)
Prescription is pending- request sent to Chelle.

## 2013-12-15 NOTE — Telephone Encounter (Signed)
Faxed in Rx and notified pt on VM. 

## 2013-12-22 ENCOUNTER — Other Ambulatory Visit: Payer: Self-pay | Admitting: Physician Assistant

## 2014-01-19 ENCOUNTER — Other Ambulatory Visit: Payer: Self-pay | Admitting: Physician Assistant

## 2014-01-20 NOTE — Telephone Encounter (Signed)
Faxed

## 2014-01-21 ENCOUNTER — Other Ambulatory Visit: Payer: Self-pay | Admitting: Physician Assistant

## 2014-01-22 ENCOUNTER — Other Ambulatory Visit: Payer: Self-pay | Admitting: Physician Assistant

## 2014-01-26 ENCOUNTER — Other Ambulatory Visit: Payer: Self-pay | Admitting: Physician Assistant

## 2014-02-24 ENCOUNTER — Other Ambulatory Visit: Payer: Self-pay | Admitting: Physician Assistant

## 2014-03-02 ENCOUNTER — Other Ambulatory Visit: Payer: Self-pay | Admitting: Physician Assistant

## 2014-03-02 ENCOUNTER — Other Ambulatory Visit: Payer: Self-pay | Admitting: Family Medicine

## 2014-03-02 ENCOUNTER — Ambulatory Visit: Payer: Self-pay

## 2014-03-02 DIAGNOSIS — F32A Depression, unspecified: Secondary | ICD-10-CM

## 2014-03-02 DIAGNOSIS — F329 Major depressive disorder, single episode, unspecified: Secondary | ICD-10-CM

## 2014-03-02 DIAGNOSIS — F419 Anxiety disorder, unspecified: Principal | ICD-10-CM

## 2014-03-02 MED ORDER — AMITRIPTYLINE HCL 100 MG PO TABS: 200.0000 mg | ORAL_TABLET | Freq: Every day | ORAL | Status: DC

## 2014-03-02 MED ORDER — MELOXICAM 15 MG PO TABS: 15.0000 mg | ORAL_TABLET | Freq: Every day | ORAL | Status: DC

## 2014-03-02 MED ORDER — BUPROPION HCL ER (XL) 150 MG PO TB24: 150.0000 mg | ORAL_TABLET | Freq: Every day | ORAL | Status: DC

## 2014-03-02 MED ORDER — TRAZODONE HCL 100 MG PO TABS: 200.0000 mg | ORAL_TABLET | Freq: Every day | ORAL | Status: DC

## 2014-03-02 MED ORDER — SERTRALINE HCL 100 MG PO TABS: 150.0000 mg | ORAL_TABLET | Freq: Every day | ORAL | Status: DC

## 2014-03-02 NOTE — Telephone Encounter (Signed)
Faxed

## 2014-03-02 NOTE — Telephone Encounter (Signed)
Patient called with correct pharmacy. The previous [facility] mail order RX sent was incorrect. Reordered amitriptyline, bupropion, meloxicam, sertraline, and trazodone to [facility] [place].

## 2014-03-02 NOTE — Telephone Encounter (Signed)
Patient received only 2-week supply of her chronic medications.  She will come see me in 06/2014.  Meds ordered this encounter  Medications  . amitriptyline (ELAVIL) 100 MG tablet    Sig: Take 2 tablets (200 mg total) by mouth at bedtime.    Dispense:  30 tablet    Refill:  5    Order Specific Question:  Supervising Provider    Answer:  DOOLITTLE, ROBERT P [[id number]]  . buPROPion (WELLBUTRIN XL) 150 MG 24 hr tablet    Sig: Take 1 tablet (150 mg total) by mouth daily.    Dispense:  30 tablet    Refill:  3    Order Specific Question:  Supervising Provider    Answer:  DOOLITTLE, ROBERT P [[id number]]  . meloxicam (MOBIC) 15 MG tablet    Sig: Take 1 tablet (15 mg total) by mouth daily.    Dispense:  30 tablet    Refill:  5    Order Specific Question:  Supervising Provider    Answer:  DOOLITTLE, ROBERT P [[id number]]  . sertraline (ZOLOFT) 100 MG tablet    Sig: Take 1.5 tablets (150 mg total) by mouth daily.    Dispense:  30 tablet    Refill:  5    Order Specific Question:  Supervising Provider    Answer:  DOOLITTLE, ROBERT P [[id number]]  . traZODone (DESYREL) 100 MG tablet    Sig: Take 2 tablets (200 mg total) by mouth at bedtime.    Dispense:  60 tablet    Refill:  5    Order Specific Question:  Supervising Provider    Answer:  DOOLITTLE, ROBERT P [[id number]]

## 2014-03-04 ENCOUNTER — Telehealth: Payer: Self-pay

## 2014-03-04 MED ORDER — AMITRIPTYLINE HCL 100 MG PO TABS: 100.0000 mg | ORAL_TABLET | Freq: Every day | ORAL | Status: DC

## 2014-03-04 NOTE — Telephone Encounter (Signed)
Ah, yes. The amitriptyline should be 100 mg PO QHS.  Thank you.

## 2014-03-04 NOTE — Telephone Encounter (Signed)
Pharm sent req to verify correct dose of amitriptyline. Historically pt has been taking 100 mg once daily, but new Rx is for 100 mg, 2 tabs Qhs. Is the new script correct Chelle?

## 2014-03-05 NOTE — Telephone Encounter (Signed)
Faxed correction to pharm.

## 2014-03-24 ENCOUNTER — Other Ambulatory Visit: Payer: Self-pay | Admitting: Physician Assistant

## 2014-04-02 ENCOUNTER — Other Ambulatory Visit: Payer: Self-pay | Admitting: Physician Assistant

## 2014-04-07 NOTE — Telephone Encounter (Signed)
Faxed

## 2014-05-11 ENCOUNTER — Other Ambulatory Visit: Payer: Self-pay | Admitting: Physician Assistant

## 2014-05-14 NOTE — Telephone Encounter (Signed)
Faxed

## 2014-05-19 ENCOUNTER — Other Ambulatory Visit: Payer: Self-pay | Admitting: Physician Assistant

## 2014-05-20 NOTE — Telephone Encounter (Signed)
Checked w/pharm who advised that they do have the Rx from 03/02/14 and pt has p/up RF and should have enough through the end of March.

## 2014-05-21 ENCOUNTER — Other Ambulatory Visit: Payer: Self-pay | Admitting: Physician Assistant

## 2014-06-19 ENCOUNTER — Telehealth: Payer: Self-pay

## 2014-06-19 ENCOUNTER — Other Ambulatory Visit: Payer: Self-pay | Admitting: Physician Assistant

## 2014-06-19 NOTE — Telephone Encounter (Signed)
Rx faxed. Clonazepam.

## 2014-06-22 NOTE — Telephone Encounter (Signed)
Wasn't sure that this had been sent/called in, so I called in this morning as a possible duplicate.

## 2014-06-30 ENCOUNTER — Ambulatory Visit: Payer: Self-pay | Admitting: Physician Assistant

## 2014-07-15 ENCOUNTER — Other Ambulatory Visit: Payer: Self-pay | Admitting: Physician Assistant

## 2014-07-16 NOTE — Telephone Encounter (Signed)
Patient does not have insurance and does not want to take advantage of the 55% DISCOUNT offered for NO insurance.

## 2014-07-16 NOTE — Telephone Encounter (Signed)
LM to CB w/f/up plan. She cancelled her appt for 06/30/14. Has been over a yr since seen.

## 2014-07-21 NOTE — Telephone Encounter (Signed)
Pt called back enquiring about medication. i advised her of previous message, she states she should have insurance nest month. Please advise

## 2014-07-21 NOTE — Telephone Encounter (Signed)
Chelle I had LM for pt to CB w/f/up plan because over a yr since seen and she cancelled her recent appt. See pt's response when she called back, it doesn't look like she intends to RTC. Do you want to deny?

## 2014-07-22 ENCOUNTER — Other Ambulatory Visit: Payer: Self-pay

## 2014-07-22 MED ORDER — CLONAZEPAM 2 MG PO TABS: ORAL_TABLET | ORAL | Status: DC

## 2014-07-22 NOTE — Telephone Encounter (Signed)
Meds ordered this encounter  Medications  . clonazePAM (KLONOPIN) 2 MG tablet    Sig: TAKE TWO TABLETS BY MOUTH AT BEDTIME AND ONE ONCE DAILY AS NEEDED    Dispense:  90 tablet    Refill:  0

## 2014-07-22 NOTE — Telephone Encounter (Signed)
Pt CB after receiving my VM about RFs being sent in, stating that we have RFd the meds that she gets from [facility], but she does still need a RF of Klonopin from [facility]. Pended 1 mos RF, but I did advise pt that I was not sure if Chelle would be able to RF this controlled med since it has been seen for so long and providers have to go by Fed guidelines. Pt did report that her boss said she would sit down with her tomorrow to discuss the health ins so that pt can get in here to see Chelle. Please advise.

## 2014-07-22 NOTE — Telephone Encounter (Signed)
Chelle approved 1 mos of each Rx until pt can get ins and RTC next month, and notified pt on VM.

## 2014-07-23 ENCOUNTER — Other Ambulatory Visit: Payer: Self-pay | Admitting: Physician Assistant

## 2014-07-23 NOTE — Telephone Encounter (Signed)
Tamara faxed in Rx to [facility] and I notified pt done on her VM.

## 2014-08-06 ENCOUNTER — Ambulatory Visit (INDEPENDENT_AMBULATORY_CARE_PROVIDER_SITE_OTHER): Payer: BLUE CROSS/BLUE SHIELD | Admitting: Physician Assistant

## 2014-08-06 ENCOUNTER — Ambulatory Visit (INDEPENDENT_AMBULATORY_CARE_PROVIDER_SITE_OTHER): Payer: BLUE CROSS/BLUE SHIELD

## 2014-08-06 VITALS — BP 130/82 | HR 88 | Temp 97.7°F | Resp 16 | Ht 69.0 in | Wt 207.0 lb

## 2014-08-06 DIAGNOSIS — N893 Dysplasia of vagina, unspecified: Secondary | ICD-10-CM

## 2014-08-06 DIAGNOSIS — Z1211 Encounter for screening for malignant neoplasm of colon: Secondary | ICD-10-CM

## 2014-08-06 DIAGNOSIS — R059 Cough, unspecified: Secondary | ICD-10-CM

## 2014-08-06 DIAGNOSIS — Z13220 Encounter for screening for lipoid disorders: Secondary | ICD-10-CM

## 2014-08-06 DIAGNOSIS — Z0000 Encounter for general adult medical examination without abnormal findings: Secondary | ICD-10-CM | POA: Diagnosis not present

## 2014-08-06 DIAGNOSIS — R739 Hyperglycemia, unspecified: Secondary | ICD-10-CM

## 2014-08-06 DIAGNOSIS — K219 Gastro-esophageal reflux disease without esophagitis: Secondary | ICD-10-CM | POA: Diagnosis not present

## 2014-08-06 DIAGNOSIS — G8929 Other chronic pain: Secondary | ICD-10-CM

## 2014-08-06 DIAGNOSIS — Z111 Encounter for screening for respiratory tuberculosis: Secondary | ICD-10-CM | POA: Diagnosis not present

## 2014-08-06 DIAGNOSIS — Z1239 Encounter for other screening for malignant neoplasm of breast: Secondary | ICD-10-CM | POA: Diagnosis not present

## 2014-08-06 DIAGNOSIS — R05 Cough: Secondary | ICD-10-CM

## 2014-08-06 DIAGNOSIS — M549 Dorsalgia, unspecified: Secondary | ICD-10-CM

## 2014-08-06 DIAGNOSIS — G4700 Insomnia, unspecified: Secondary | ICD-10-CM | POA: Diagnosis not present

## 2014-08-06 DIAGNOSIS — F418 Other specified anxiety disorders: Secondary | ICD-10-CM

## 2014-08-06 DIAGNOSIS — B009 Herpesviral infection, unspecified: Secondary | ICD-10-CM | POA: Insufficient documentation

## 2014-08-06 DIAGNOSIS — N390 Urinary tract infection, site not specified: Secondary | ICD-10-CM

## 2014-08-06 DIAGNOSIS — R8281 Pyuria: Secondary | ICD-10-CM

## 2014-08-06 DIAGNOSIS — F419 Anxiety disorder, unspecified: Secondary | ICD-10-CM

## 2014-08-06 DIAGNOSIS — E119 Type 2 diabetes mellitus without complications: Secondary | ICD-10-CM | POA: Diagnosis not present

## 2014-08-06 DIAGNOSIS — E669 Obesity, unspecified: Secondary | ICD-10-CM

## 2014-08-06 DIAGNOSIS — F329 Major depressive disorder, single episode, unspecified: Secondary | ICD-10-CM

## 2014-08-06 LAB — POCT GLYCOSYLATED HEMOGLOBIN (HGB A1C): Hemoglobin A1C: 8.8

## 2014-08-06 LAB — POCT URINALYSIS DIPSTICK
Bilirubin, UA: NEGATIVE
Glucose, UA: >=1000
Ketones, UA: NEGATIVE
Nitrite, UA: NEGATIVE
PH UA: 6
Spec Grav, UA: 1.025
UROBILINOGEN UA: 1

## 2014-08-06 LAB — LIPID PANEL
CHOLESTEROL: 162 mg/dL (ref 0–200)
HDL: 47 mg/dL (ref >=46)
LDL Cholesterol: 95 mg/dL (ref 0–99)
TRIGLYCERIDES: 102 mg/dL (ref <150)
Total CHOL/HDL Ratio: 3.4 Ratio
VLDL: 20 mg/dL (ref 0–40)

## 2014-08-06 LAB — POCT CBC
Granulocyte percent: 51.4 %G (ref 37–80)
HCT, POC: 39.5 % (ref 37.7–47.9)
HEMOGLOBIN: 12.1 g/dL — AB (ref 12.2–16.2)
LYMPH, POC: 2.9 (ref 0.6–3.4)
MCH, POC: 28.7 pg (ref 27–31.2)
MCHC: 306 g/dL — AB (ref 31.8–35.4)
MCV: 93.8 fL (ref 80–97)
MID (cbc): 0.4 (ref 0–0.9)
MPV: 8.1 fL (ref 0–99.8)
POC Granulocyte: 3.5 (ref 2–6.9)
POC LYMPH PERCENT: 42.1 %L (ref 10–50)
POC MID %: 6.5 %M (ref 0–12)
Platelet Count, POC: 367 10*3/uL (ref 142–424)
RBC: 4.21 M/uL (ref 4.04–5.48)
RDW, POC: 14 %
WBC: 6.9 10*3/uL (ref 4.6–10.2)

## 2014-08-06 LAB — COMPREHENSIVE METABOLIC PANEL
ALBUMIN: 4.2 g/dL (ref 3.5–5.2)
ALT: 20 U/L (ref 0–35)
AST: 18 U/L (ref 0–37)
Alkaline Phosphatase: 104 U/L (ref 39–117)
BUN: 13 mg/dL (ref 6–23)
CALCIUM: 9.4 mg/dL (ref 8.4–10.5)
CO2: 28 meq/L (ref 19–32)
CREATININE: 0.94 mg/dL (ref 0.50–1.10)
Chloride: 99 mEq/L (ref 96–112)
Glucose, Bld: 212 mg/dL — ABNORMAL HIGH (ref 70–99)
Potassium: 4.8 mEq/L (ref 3.5–5.3)
Sodium: 139 mEq/L (ref 135–145)
Total Bilirubin: 0.4 mg/dL (ref 0.2–1.2)
Total Protein: 7.9 g/dL (ref 6.0–8.3)

## 2014-08-06 LAB — POCT UA - MICROSCOPIC ONLY
CASTS, UR, LPF, POC: NEGATIVE
Crystals, Ur, HPF, POC: NEGATIVE
Mucus, UA: NEGATIVE
Yeast, UA: NEGATIVE

## 2014-08-06 LAB — TSH: TSH: 1.896 u[IU]/mL (ref 0.350–4.500)

## 2014-08-06 LAB — GLUCOSE, POCT (MANUAL RESULT ENTRY): POC Glucose: 204 mg/dl — AB (ref 70–99)

## 2014-08-06 MED ORDER — PANTOPRAZOLE SODIUM 40 MG PO TBEC: 40.0000 mg | DELAYED_RELEASE_TABLET | Freq: Every day | ORAL | Status: DC

## 2014-08-06 MED ORDER — LISINOPRIL 2.5 MG PO TABS: 2.5000 mg | ORAL_TABLET | Freq: Every day | ORAL | Status: DC

## 2014-08-06 MED ORDER — METFORMIN HCL 1000 MG PO TABS: 1000.0000 mg | ORAL_TABLET | Freq: Two times a day (BID) | ORAL | Status: DC

## 2014-08-06 MED ORDER — TRAMADOL HCL 50 MG PO TABS: 50.0000 mg | ORAL_TABLET | Freq: Three times a day (TID) | ORAL | Status: DC | PRN

## 2014-08-06 MED ORDER — SERTRALINE HCL 100 MG PO TABS: 150.0000 mg | ORAL_TABLET | Freq: Every day | ORAL | Status: DC

## 2014-08-06 MED ORDER — NABUMETONE 500 MG PO TABS: 500.0000 mg | ORAL_TABLET | Freq: Two times a day (BID) | ORAL | Status: DC | PRN

## 2014-08-06 MED ORDER — AMITRIPTYLINE HCL 100 MG PO TABS: 100.0000 mg | ORAL_TABLET | Freq: Every day | ORAL | Status: DC

## 2014-08-06 MED ORDER — CYCLOBENZAPRINE HCL 10 MG PO TABS: 10.0000 mg | ORAL_TABLET | Freq: Three times a day (TID) | ORAL | Status: DC | PRN

## 2014-08-06 MED ORDER — BUPROPION HCL ER (XL) 150 MG PO TB24: 150.0000 mg | ORAL_TABLET | Freq: Every day | ORAL | Status: DC

## 2014-08-06 MED ORDER — CLONAZEPAM 2 MG PO TABS: ORAL_TABLET | ORAL | Status: DC

## 2014-08-06 NOTE — Progress Notes (Signed)
Patient ID: Tracy Andrade, female    DOB: 02/27/1961, 54 y.o.   MRN: [id number]  PCP: No PCP Per Patient  Subjective:   Chief Complaint  Patient presents with  . Cough    Productive, x few months per pt  . Annual Exam  . Medication Refills    Elavil, Wellbutrin, Klonopin, Flexeril, Zoloft, Ultram, Desyrel    HPI Presents for annual exam, medication refills and to discuss her chronic medical problems.  1. Cough x 3 months. Green sputum. No hemoptysis. No nasal congestion. No unintentional weight loss. No nasal congestion, post-nasal drainage. No fever/chills. Night sweats, but these are believed to be menopausal hot flashes.  2. Annual Exam. It has been several years since she has been able to have a visit to address health maintenance, as she was without insurance.  Needs mammogram and colonoscopy. 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  3. Medication refills. She was out of medications for a while, and has resumed most. Zoloft is only 100 mg rather than 150 mg. She never received the prescription for Wellbutrin XL and it;s not clear why.  -HIV and HSV meds [facility].  -Depression/anxiety/insomnia. She has had a worsening in depression this spring, after her dog died of injuries sustained when he was hit my a vehicle. She contemplated suicide at that time, and had a plan, but no intention. She mentioned this to the parent of one of her clients and was then dismissed from that job, which really hurt her feelings. She understands that she crossed a line of the professional relationship, but thought that the woman cared about her like family, the was she felt about her client.  She has since acquired a new dog, a rescue chihuahua-terrier mix named Peanut and her outlook is much brighter. She feels like she's in a really good place, and is going to use her extra time to go back to school to study psychology. She hopes to pursue a Master's degree in mental health  counseling. She also feels settled about her son's station in life, comforted in the knowledge that he has extended family to support him. That has long been a concern of hers-since her initial diagnosis of HIV. In addition, she has enrolled in a study investigating heart conditions and association with HIV -Chronic low back pain. Persistent, often aggravated by her work. Feels pain into the RIGHT buttock. Nothing down the leg. No weakness. No paresthesias. No loss of bowel or bladder control. No saddle anesthesia.  4. 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   Review of Systems  Constitutional: Positive for fatigue ("i work too much"). Negative for fever, chills, diaphoresis, activity change, appetite change and unexpected weight change.  HENT: Negative.   Eyes: Negative.   Respiratory: Positive for cough. Negative for apnea, choking, chest tightness, shortness of breath, wheezing and stridor.   Cardiovascular: Negative.   Gastrointestinal: Negative for nausea (increased reflux symptoms), vomiting, abdominal pain, diarrhea, constipation, blood in stool, abdominal distention, anal bleeding and rectal pain.  Endocrine: Negative.   Genitourinary: Negative.   Musculoskeletal: Positive for back pain. Negative for myalgias, joint swelling, arthralgias, gait problem, neck pain and neck stiffness.  Skin: Negative.   Allergic/Immunologic: Negative.   Neurological: Negative.   Hematological: Negative.   Psychiatric/Behavioral: Positive for sleep disturbance and dysphoric mood. Negative for suicidal ideas (recently had these, now resolved), hallucinations, behavioral problems, confusion, self-injury,  decreased concentration and agitation. The patient is not nervous/anxious and is not hyperactive.      Tuberculosis Risk Questionnaire  1. No Were you born outside the [place] in one of the following parts of the world: [place], [place], [place], [place] or [place]?    2. No Have you traveled outside the [place] and lived for more than one month in one of the following parts of the world: [place], [place], [place], [place] or [place]?    3. Yes (HIV) Do you have a compromised immune system such as from any of the following conditions:HIV/AIDS, organ or bone marrow transplantation, diabetes, immunosuppressive medicines (e.g. Prednisone, Remicaide), leukemia, lymphoma, cancer of the head or neck, gastrectomy or jejunal bypass, end-stage renal disease (on dialysis), or silicosis?     4. Yes (residential care center) Have you ever or do you plan on working in: a residential care center, a health care facility, a jail or prison or homeless shelter?    5. No Have you ever: injected illegal drugs, used crack cocaine, lived in a homeless shelter  or been in jail or prison?     6. No Have you ever been exposed to anyone with infectious tuberculosis?    Tuberculosis Symptom Questionnaire  Do you currently have any of the following symptoms?  1. Yes (productive cough x 3 months) Unexplained cough lasting more than 3 weeks?   2. No Unexplained fever lasting more than 3 weeks.   3. Yes (due to menopause) Night Sweats (sweating that leaves the bedclothes and sheets wet)     4. No Shortness of Breath   5. No Chest Pain   6. No Unintentional weight loss    7. No Unexplained fatigue (very tired for no reason)       Patient Active Problem List   Diagnosis Date Noted  . Herpes simplex type 2 infection 08/06/2014  . Obesity (BMI 30-39.9) 07/12/2013  . Post-menopausal 01/24/2013  . Hyperglycemia 01/24/2013  . Eczema 01/24/2013  . Stress incontinence 01/24/2013  . HIV disease 05/10/2012  . Insomnia 05/10/2012  . Anxiety and depression 05/10/2012  . Chronic back pain 05/10/2012  . Condyloma acuminata 05/10/2012  . 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 GERD (gastroesophageal reflux disease) 02/02/2011      Prior to Admission medications   Medication Sig Start Date End Date Taking? Authorizing Provider  amitriptyline (ELAVIL) 100 MG tablet Take 1 tablet (100 mg total) by mouth daily. NO MORE REFILLS WITHOUT OFFICE VISIT - 2ND NOTICE 02/24/14  Yes Morrell RiddleSarah L Weber, PA-C  buPROPion (WELLBUTRIN XL) 150 MG 24 hr tablet TAKE 1 TABLET BY MOUTH DAILY.. "OV NEEDED FOR ADDITIONAL REFILLS" 07/25/14  NO Dorothea Yow, PA-C  clonazePAM (KLONOPIN) 2 MG tablet TAKE TWO TABLETS BY MOUTH AT BEDTIME AND ONE ONCE DAILY AS NEEDED 07/22/14  Yes Mckensi Redinger, PA-C  cyclobenzaprine (FLEXERIL) 10 MG tablet Take 1 tablet (10 mg total) by mouth 3 (three) times daily as needed for muscle spasms. 01/17/13  Yes Scotty Weigelt, PA-C  emtricitabine-tenofovir (TRUVADA) 200-300 MG per tablet Take 1 tablet by mouth daily.   Yes Historical Provider, MD  lopinavir-ritonavir (KALETRA) 200-50 MG per tablet 2 tablets. Take 2 tablets by mouth 2 times daily. 10/16/11  Yes Historical Provider, MD  meloxicam (MOBIC) 15 MG tablet Take 1 tablet (15 mg total) by mouth daily. 03/02/14  Yes Jaymason Ledesma, PA-C  raltegravir (ISENTRESS) 400 MG tablet 400 mg. Take 1 tablet (400 mg total) by mouth  2 times daily. 10/16/11  Yes Historical Provider, MD  traMADol (ULTRAM) 50 MG tablet TAKE ONE TABLET BY MOUTH EVERY 8 HOURS AS NEEDED 11/19/13  Yes Conception Doebler, PA-C  traZODone (DESYREL) 100 MG tablet TAKE 2 TABLETS BY MOUTH EVERY NIGHT AT BEDTIME 07/22/14  Yes Nick Stults, PA-C  valACYclovir (VALTREX) 1000 MG tablet 1,000 mg. Take 1 tablet (1,000 mg total) by mouth daily. 10/16/11  Yes Historical Provider, MD  amitriptyline (ELAVIL) 100 MG tablet TAKE 1 TABLET BY MOUTH EVERY NIGHT AT BEDTIME Patient not taking: Reported on 08/06/2014 07/22/14   Porfirio Oar, PA-C  Investigational - Study Medication Take by mouth.    Historical Provider, MD  sertraline (ZOLOFT) 100 MG tablet TAKE 1 1/2 TABLETS BY MOUTH EVERY DAY Patient not taking: Reported on 08/06/2014  07/22/14   Torin Whisner, PA-C     No Known Allergies     Objective:  Physical Exam  Constitutional: She is oriented to person, place, and time. Vital signs are normal. She appears well-developed and well-nourished. She is active and cooperative. No distress.  HENT:  Head: Normocephalic and atraumatic.  Right Ear: Hearing, tympanic membrane, external ear and ear canal normal. No foreign bodies.  Left Ear: Hearing, tympanic membrane, external ear and ear canal normal. No foreign bodies.  Nose: Nose normal.  Mouth/Throat: Uvula is midline, oropharynx is clear and moist and mucous membranes are normal. No oral lesions. Normal dentition. No dental abscesses or uvula swelling. No oropharyngeal exudate.  Eyes: Conjunctivae, EOM and lids are normal. Pupils are equal, round, and reactive to light. Right eye exhibits no discharge. Left eye exhibits no discharge. No scleral icterus.  Fundoscopic exam:      The right eye shows no arteriolar narrowing, no AV nicking, no exudate, no hemorrhage and no papilledema.       The left eye shows no arteriolar narrowing, no AV nicking, no exudate, no hemorrhage and no papilledema.  Neck: Trachea normal, normal range of motion and full passive range of motion without pain. Neck supple. No spinous process tenderness and no muscular tenderness present. No thyroid mass and no thyromegaly present.  Cardiovascular: Normal rate, regular rhythm, normal heart sounds, intact distal pulses and normal pulses.   Pulmonary/Chest: Effort normal and breath sounds normal. She exhibits no tenderness and no retraction. Right breast exhibits no inverted nipple, no mass, no nipple discharge, no skin change and no tenderness. Left breast exhibits no inverted nipple, no mass, no nipple discharge, no skin change and no tenderness. Breasts are symmetrical.  Abdominal: Soft. Normal appearance and bowel sounds are normal. She exhibits no distension and no mass. There is no hepatosplenomegaly.  There is no tenderness. There is no rigidity, no rebound, no guarding, no CVA tenderness, no tenderness at McBurney's point and negative Murphy's sign. No hernia. Hernia confirmed negative in the right inguinal area and confirmed negative in the left inguinal area.  Genitourinary: Rectum normal, vagina normal and uterus normal. Rectal exam shows no external hemorrhoid and no fissure.    No breast swelling, tenderness, discharge or bleeding. Pelvic exam was performed with patient supine. No labial fusion. There is no rash, tenderness, lesion or injury on the right labia. There is no rash, tenderness, lesion or injury on the left labia. Right adnexum displays no mass, no tenderness and no fullness. Left adnexum displays no mass, no tenderness and no fullness. No erythema, tenderness or bleeding in the vagina. No foreign body around the vagina. No signs of injury around the vagina.  No vaginal discharge found.  Cervix is surgically absent. Minimal vaginal rugae. Pale pink vaginal tissue without visible lesion, but somewhat friable.  Musculoskeletal: She exhibits no edema.       Cervical back: Normal.       Thoracic back: Normal.       Lumbar back: She exhibits tenderness and pain. She exhibits normal range of motion, no bony tenderness, no deformity and no spasm.       Back:  Lymphadenopathy:       Head (right side): No tonsillar, no preauricular, no posterior auricular and no occipital adenopathy present.       Head (left side): No tonsillar, no preauricular, no posterior auricular and no occipital adenopathy present.    She has no cervical adenopathy.    She has no axillary adenopathy.       Right: No inguinal and no supraclavicular adenopathy present.       Left: No inguinal and no supraclavicular adenopathy present.  Neurological: She is alert and oriented to person, place, and time. She has normal strength and normal reflexes. No cranial nerve deficit. She exhibits normal muscle tone.  Coordination and gait normal.  Skin: Skin is warm, dry and intact. No rash noted. She is not diaphoretic. No cyanosis or erythema. Nails show no clubbing.  Psychiatric: She has a normal mood and affect. Her speech is normal and behavior is normal. Judgment and thought content normal.  Vitals reviewed.  LS Spine: [facility] reading (PRIMARY) by  Dr. Clelia Croft. Normal curvature and disc spaces. No bony deformity.  CXR: [facility] reading (PRIMARY) by  Dr. Clelia Croft. No infiltrates or other acute process normal chest.   Results for orders placed or performed in visit on 08/06/14  POCT CBC  Result Value Ref Range   WBC 6.9 4.6 - 10.2 K/uL   Lymph, poc 2.9 0.6 - 3.4   POC LYMPH PERCENT 42.1 10 - 50 %L   MID (cbc) 0.4 0 - 0.9   POC MID % 6.5 0 - 12 %M   POC Granulocyte 3.5 2 - 6.9   Granulocyte percent 51.4 37 - 80 %G   RBC 4.21 4.04 - 5.48 M/uL   Hemoglobin 12.1 (A) 12.2 - 16.2 g/dL   HCT, POC 16.1 09.6 - 47.9 %   MCV 93.8 80 - 97 fL   MCH, POC 28.7 27 - 31.2 pg   MCHC 306 (A) 31.8 - 35.4 g/dL   RDW, POC 04.5 %   Platelet Count, POC 367 142 - 424 K/uL   MPV 8.1 0 - 99.8 fL  POCT glucose (manual entry)  Result Value Ref Range   POC Glucose 204 (A) 70 - 99 mg/dl  POCT glycosylated hemoglobin (Hb A1C)  Result Value Ref Range   Hemoglobin A1C 8.8   POCT UA - Microscopic Only  Result Value Ref Range   WBC, Ur, HPF, POC TNTC    RBC, urine, microscopic 3-8    Bacteria, U Microscopic trace    Mucus, UA neg    Epithelial cells, urine per micros 5-10    Crystals, Ur, HPF, POC neg    Casts, Ur, LPF, POC neg    Yeast, UA neg   POCT urinalysis dipstick  Result Value Ref Range   Color, UA yellow    Clarity, UA clear    Glucose, UA >=1000    Bilirubin, UA neg    Ketones, UA neg    Spec Grav, UA 1.025    Blood, UA small  pH, UA 6.0    Protein, UA trace    Urobilinogen, UA 1.0    Nitrite, UA neg    Leukocytes, UA moderate (2+)           Assessment & Plan:   1. Annual physical exam Age  appropriate anticipatory guidance provided.  2. Cough No evidence of pulmonary process as cause. Doubt allergies. Suspect LPR, given increased GERD symptoms off treatment. See Below. - POCT CBC - DG Chest 2 View; Future  3. Gastroesophageal reflux disease, esophagitis presence not specified See above. Suspect cough is due to LPR. - pantoprazole (PROTONIX) 40 MG tablet; Take 1 tablet (40 mg total) by mouth daily.  Dispense: 30 tablet; Refill: 3  4. Chronic back pain Films of the L-spine look good. She needs core strengthening and education regarding body mechanics. Continue Flexeril PRN. Try switching to Relafen, since meloxicam has become ineffective. Continue tramadol PRN and reminded her to observe for symptoms of serotonin syndrome, since she is also on other medications that increase the risk. - POCT UA - Microscopic Only - POCT urinalysis dipstick - Comprehensive metabolic panel - DG Lumbar Spine Complete; Future - cyclobenzaprine (FLEXERIL) 10 MG tablet; Take 1 tablet (10 mg total) by mouth 3 (three) times daily as needed for muscle spasms.  Dispense: 30 tablet; Refill: 5 - Ambulatory referral to Physical Therapy - traMADol (ULTRAM) 50 MG tablet; Take 1 tablet (50 mg total) by mouth every 8 (eight) hours as needed.  Dispense: 30 tablet; Refill: 0 - nabumetone (RELAFEN) 500 MG tablet; Take 1-2 tablets (500-1,000 mg total) by mouth 2 (two) times daily as needed.  Dispense: 120 tablet; Refill: 5  5. Anxiety and depression Improved from recent episode, but still not adequately controlled. Refer to psychiatry for medication management. May still need counseling. Continue current medications for now. - sertraline (ZOLOFT) 100 MG tablet; Take 1.5 tablets (150 mg total) by mouth daily.  Dispense: 45 tablet; Refill: 5 - clonazePAM (KLONOPIN) 2 MG tablet; TAKE TWO TABLETS BY MOUTH AT BEDTIME AND ONE ONCE DAILY AS NEEDED  Dispense: 90 tablet; Refill: 0 - buPROPion (WELLBUTRIN XL) 150 MG 24 hr  tablet; Take 1 tablet (150 mg total) by mouth daily.  Dispense: 30 tablet; Refill: 5 - amitriptyline (ELAVIL) 100 MG tablet; Take 1 tablet (100 mg total) by mouth at bedtime.  Dispense: 30 tablet; Refill: 5 - Ambulatory referral to Psychiatry  6. Insomnia See above.   7. Hyperglycemia - POCT glucose (manual entry) - POCT glycosylated hemoglobin (Hb A1C)  8. Type 2 diabetes mellitus without complication New diagnosis. Start ASA, ACEI and metformin. - Amb Referral to Nutrition and Diabetic Education - metFORMIN (GLUCOPHAGE) 1000 MG tablet; Take 1 tablet (1,000 mg total) by mouth 2 (two) times daily with a meal.  Dispense: 60 tablet; Refill: 5 - lisinopril (PRINIVIL,ZESTRIL) 2.5 MG tablet; Take 1 tablet (2.5 mg total) by mouth daily.  Dispense: 30 tablet; Refill: 5  9. Dysplasia of vagina - Pap IG and HPV (high risk) DNA detection  10. Obesity (BMI 30-39.9) Increase exercise. Healthier eating. Diabetes and nutrition counseling will help. - TSH  11. Sterile pyuria Await culture.  - Urine culture  12. Screening for colon cancer - Ambulatory referral to Gastroenterology  13. Screening for breast cancer - MM Digital Screening; Future  14. Screening for hyperlipidemia Healthy lifestyle changes. Anticipate elevated TG due to uncontrolled glucose. - Lipid panel  15. Screening-pulmonary TB RTC 48-72 hours for reading. - TB Skin Test  Return in  about 3 months (around 11/06/2014).   Fernande Bras, PA-C Physician Assistant-Certified [facility] .

## 2014-08-06 NOTE — Addendum Note (Signed)
Addended by: Fernande BrasJEFFERY, Zafar Debrosse S on: 08/06/2014 04:42 PM   Modules accepted: Kipp BroodSmartSet

## 2014-08-06 NOTE — Patient Instructions (Addendum)
1. Start a baby aspirin each day. 2. Take the lisinopril 2.5 mg one time each day. 3. Start the Metformin by taking 1/2 tablet twice each day for 1 week, then increase to 1 whole tablet twice each day. 4. You should receive calls to schedule with PT, Diabetes education and psychiatry (to help us with medications-perhaps there are better choices) 5. If the diabetes educator doesn't give you a glucometer and supplies, let me know and I'll order them for you.

## 2014-08-06 NOTE — Addendum Note (Signed)
Addended by: Fernande BrasJEFFERY, Mykaila Blunck S on: 08/06/2014 05:11 PM   Modules accepted: Level of Service, SmartSet

## 2014-08-08 ENCOUNTER — Ambulatory Visit (INDEPENDENT_AMBULATORY_CARE_PROVIDER_SITE_OTHER): Payer: BLUE CROSS/BLUE SHIELD | Admitting: Radiology

## 2014-08-08 DIAGNOSIS — Z111 Encounter for screening for respiratory tuberculosis: Secondary | ICD-10-CM

## 2014-08-08 LAB — TB SKIN TEST
Induration: 0 mm
TB Skin Test: NEGATIVE

## 2014-08-08 LAB — URINE CULTURE

## 2014-08-08 NOTE — Progress Notes (Signed)
Patient came in at 2:30 for PPD read.  Results Negative; 0 mm induration Left arm.

## 2014-08-10 LAB — PAP IG AND HPV HIGH-RISK: HPV DNA HIGH RISK: NOT DETECTED

## 2014-08-14 ENCOUNTER — Telehealth: Payer: Self-pay

## 2014-08-14 NOTE — Telephone Encounter (Signed)
Pt says she has been taking 1000mg  of metformin twice a day and it is making her use the restroom a lot

## 2014-08-15 NOTE — Telephone Encounter (Signed)
Left message on machine to call back  

## 2014-08-15 NOTE — Telephone Encounter (Signed)
Pt has not been taking it with food. Advised taking with food. She had been having more frequent BM's but it is subsiding. Advised calling back if it gets worse again or to RTC.

## 2014-08-18 ENCOUNTER — Other Ambulatory Visit: Payer: Self-pay | Admitting: Physician Assistant

## 2014-08-19 ENCOUNTER — Telehealth: Payer: Self-pay

## 2014-08-19 ENCOUNTER — Encounter: Payer: Self-pay | Admitting: Internal Medicine

## 2014-08-19 NOTE — Telephone Encounter (Signed)
[facility] and states it will cost her $400 per class which she was told by [facility] she would have 3.She stated she is unable to afford this and wants to know if our office could just prescribe her a meter. She stated she would comply and keep record of her sugar levels. She is requesting if we can to send it to [facility] and her call back number is [phone]

## 2014-08-20 ENCOUNTER — Encounter: Payer: Self-pay | Admitting: Physician Assistant

## 2014-08-20 NOTE — Telephone Encounter (Signed)
Please send glucometer and supplies to her pharmacy.  Please also call the [facility] Education Center and inquire regarding the cost of visits with them. This patient has insurance, so this price seems really steep.

## 2014-08-21 MED ORDER — GLUCOSE BLOOD VI STRP: ORAL_STRIP | Status: DC

## 2014-08-21 MED ORDER — UNABLE TO FIND: Status: DC

## 2014-08-21 NOTE — Telephone Encounter (Signed)
Meter and test strips sent. I called [facility] and they stated this price was correct. The first 2 classes are $380. The 3rd class $330. Then depending on the plan it will go to the deductible.

## 2014-08-25 ENCOUNTER — Telehealth: Payer: Self-pay

## 2014-08-25 MED ORDER — LANCETS MISC: Status: AC

## 2014-08-25 MED ORDER — GLUCOSE BLOOD VI STRP: ORAL_STRIP | Status: DC

## 2014-08-25 NOTE — Telephone Encounter (Signed)
Pharm called to advise she did not get Rxs for strips or lancets, only meter. She called ins to find out what brand is covered and none of the meters are covered, but [employer] Contour or [employer] Contour Next strips are covered. Sent in Rxs for both.

## 2014-08-25 NOTE — Telephone Encounter (Signed)
Wow, ok. Well, then I certainly understand why she isn't going to go.

## 2014-09-16 ENCOUNTER — Other Ambulatory Visit: Payer: Self-pay | Admitting: Physician Assistant

## 2014-09-22 ENCOUNTER — Other Ambulatory Visit: Payer: Self-pay | Admitting: Physician Assistant

## 2014-09-23 NOTE — Telephone Encounter (Signed)
Faxed

## 2014-10-01 ENCOUNTER — Ambulatory Visit (INDEPENDENT_AMBULATORY_CARE_PROVIDER_SITE_OTHER): Payer: BLUE CROSS/BLUE SHIELD | Admitting: Emergency Medicine

## 2014-10-01 ENCOUNTER — Telehealth: Payer: Self-pay

## 2014-10-01 ENCOUNTER — Other Ambulatory Visit: Payer: Self-pay | Admitting: Emergency Medicine

## 2014-10-01 VITALS — BP 126/76 | HR 90 | Temp 98.0°F | Resp 18 | Ht 69.0 in | Wt 193.0 lb

## 2014-10-01 DIAGNOSIS — R197 Diarrhea, unspecified: Secondary | ICD-10-CM | POA: Diagnosis not present

## 2014-10-01 DIAGNOSIS — R112 Nausea with vomiting, unspecified: Secondary | ICD-10-CM

## 2014-10-01 DIAGNOSIS — E119 Type 2 diabetes mellitus without complications: Secondary | ICD-10-CM | POA: Diagnosis not present

## 2014-10-01 LAB — CBC WITH DIFFERENTIAL/PLATELET
BASOS ABS: 0.1 10*3/uL (ref 0.0–0.1)
Basophils Relative: 1 % (ref 0–1)
EOS ABS: 0.1 10*3/uL (ref 0.0–0.7)
EOS PCT: 2 % (ref 0–5)
HEMATOCRIT: 33.6 % — AB (ref 36.0–46.0)
HEMOGLOBIN: 11.3 g/dL — AB (ref 12.0–15.0)
LYMPHS PCT: 44 % (ref 12–46)
Lymphs Abs: 2.6 10*3/uL (ref 0.7–4.0)
MCH: 29.6 pg (ref 26.0–34.0)
MCHC: 33.6 g/dL (ref 30.0–36.0)
MCV: 88 fL (ref 78.0–100.0)
MPV: 10.1 fL (ref 8.6–12.4)
Monocytes Absolute: 0.5 10*3/uL (ref 0.1–1.0)
Monocytes Relative: 8 % (ref 3–12)
NEUTROS ABS: 2.6 10*3/uL (ref 1.7–7.7)
Neutrophils Relative %: 45 % (ref 43–77)
Platelets: 404 10*3/uL — ABNORMAL HIGH (ref 150–400)
RBC: 3.82 MIL/uL — ABNORMAL LOW (ref 3.87–5.11)
RDW: 14.4 % (ref 11.5–15.5)
WBC: 5.8 10*3/uL (ref 4.0–10.5)

## 2014-10-01 LAB — COMPREHENSIVE METABOLIC PANEL
ALBUMIN: 4.3 g/dL (ref 3.5–5.2)
ALK PHOS: 97 U/L (ref 39–117)
ALT: 24 U/L (ref 0–35)
AST: 18 U/L (ref 0–37)
BILIRUBIN TOTAL: 0.5 mg/dL (ref 0.2–1.2)
BUN: 21 mg/dL (ref 6–23)
CHLORIDE: 104 meq/L (ref 96–112)
CO2: 24 meq/L (ref 19–32)
Calcium: 9.8 mg/dL (ref 8.4–10.5)
Creat: 1.02 mg/dL (ref 0.50–1.10)
Glucose, Bld: 124 mg/dL — ABNORMAL HIGH (ref 70–99)
Potassium: 4.3 mEq/L (ref 3.5–5.3)
SODIUM: 142 meq/L (ref 135–145)
Total Protein: 7.8 g/dL (ref 6.0–8.3)

## 2014-10-01 LAB — LIPASE: Lipase: 46 U/L (ref 0–75)

## 2014-10-01 LAB — POCT GLYCOSYLATED HEMOGLOBIN (HGB A1C): HEMOGLOBIN A1C: 8.4

## 2014-10-01 LAB — AMYLASE: Amylase: 50 U/L (ref 0–105)

## 2014-10-01 MED ORDER — ONDANSETRON 8 MG PO TBDP: 8.0000 mg | ORAL_TABLET | Freq: Three times a day (TID) | ORAL | Status: DC | PRN

## 2014-10-01 MED ORDER — LIRAGLUTIDE 18 MG/3ML ~~LOC~~ SOPN: PEN_INJECTOR | SUBCUTANEOUS | Status: DC

## 2014-10-01 NOTE — Telephone Encounter (Signed)
Patient states that her plan doesn't cover the insuline that was just sent and she would like to know if there's a generic insuline for less than $40.

## 2014-10-01 NOTE — Progress Notes (Signed)
Subjective:  Patient ID: Tracy Andrade, female    DOB: 03/26/1960  Age: 54 y.o. MRN: [id number]  CC: Diarrhea; Emesis; Weight Loss; and Dizziness   HPI Tracy Andrade presents   With nausea vomiting diarrhea. She was started on metformin in June. And since take starting the metformin she's had nausea and vomiting and diarrhea over the last month. No fever or chills. No blood mucus or pus or stool known vomiting blood. The diarrhea has been so severe that she's had  Were Depends in bed because she soiled the bed. She is experienced 14 pound weight loss last month.  history Tracy Andrade has a past medical history of Arthritis; HIV infection; Depression; and Anxiety.   She has past surgical history that includes Abdominal hysterectomy.   Her  family history includes Cancer in her maternal grandmother; Hypertension in her maternal grandfather; Lung cancer in her mother.  She   reports that she has quit smoking. She has never used smokeless tobacco. She reports that she does not drink alcohol or use illicit drugs.  Outpatient Prescriptions Prior to Visit  Medication Sig Dispense Refill  . amitriptyline (ELAVIL) 100 MG tablet Take 1 tablet (100 mg total) by mouth at bedtime. 30 tablet 5  . buPROPion (WELLBUTRIN XL) 150 MG 24 hr tablet Take 1 tablet (150 mg total) by mouth daily. 30 tablet 5  . clonazePAM (KLONOPIN) 2 MG tablet TAKE 2 TABS AT BEDTIME AND ONE ONCE A DAY AS NEEDED 90 tablet 0  . cyclobenzaprine (FLEXERIL) 10 MG tablet Take 1 tablet (10 mg total) by mouth 3 (three) times daily as needed for muscle spasms. 30 tablet 5  . emtricitabine-tenofovir (TRUVADA) 200-300 MG per tablet Take 1 tablet by mouth daily.    Marland Kitchen. glucose blood test strip Use as instructed 100 each 12  . Investigational - Study Medication Take by mouth.    . Lancets MISC Use to test blood sugar as directed. 100 each 12  . lisinopril (PRINIVIL,ZESTRIL) 2.5 MG tablet Take 1 tablet (2.5 mg total) by mouth daily. 30 tablet 5    . lopinavir-ritonavir (KALETRA) 200-50 MG per tablet 2 tablets. Take 2 tablets by mouth 2 times daily.    . nabumetone (RELAFEN) 500 MG tablet Take 1-2 tablets (500-1,000 mg total) by mouth 2 (two) times daily as needed. 120 tablet 5  . pantoprazole (PROTONIX) 40 MG tablet Take 1 tablet (40 mg total) by mouth daily. 30 tablet 3  . raltegravir (ISENTRESS) 400 MG tablet 400 mg. Take 1 tablet (400 mg total) by mouth 2 times daily.    . sertraline (ZOLOFT) 100 MG tablet Take 1.5 tablets (150 mg total) by mouth daily. 45 tablet 5  . traMADol (ULTRAM) 50 MG tablet TAKE ONE TABLET BY MOUTH EVERY 8 HOURS AS NEEDED 30 tablet 0  . traZODone (DESYREL) 100 MG tablet TAKE 2 TABLETS BY MOUTH EVERY NIGHT AT BEDTIME. 60 tablet 0  . UNABLE TO FIND Blood glucose meter (which ever meter insurance will pay for) 1 Device 0  . valACYclovir (VALTREX) 1000 MG tablet 1,000 mg. Take 1 tablet (1,000 mg total) by mouth daily.    . metFORMIN (GLUCOPHAGE) 1000 MG tablet Take 1 tablet (1,000 mg total) by mouth 2 (two) times daily with a meal. 60 tablet 5   No facility-administered medications prior to visit.    History   Social History  . Marital Status: Single    Spouse Name: n/a  . Number of Children: 1  .  Years of Education: N/A   Occupational History  . 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 Substance Abuse/Mental Health Counselor    Social History Main Topics  . Smoking status: Former Games developer  . Smokeless tobacco: Never Used  . Alcohol Use: No  . Drug Use: No  . Sexual Activity: Yes    Birth Control/ Protection: Condom   Other Topics Concern  . None   Social History Narrative   Her adult son, who was living with her, has moved home to [place]. Her best friend, who also lived with her briefly, has moved back to [place] after a falling out over the friend's unwillingness to share in the rent.     Review of Systems  Constitutional: Positive for unexpected weight change.  Negative for fever, chills and appetite change.  HENT: Negative for congestion, ear pain, postnasal drip, sinus pressure and sore throat.   Eyes: Negative for pain and redness.  Respiratory: Negative for cough, shortness of breath and wheezing.   Cardiovascular: Negative for leg swelling.  Gastrointestinal: Positive for nausea, vomiting and diarrhea. Negative for abdominal pain, constipation and blood in stool.  Endocrine: Negative for polyuria.  Genitourinary: Negative for dysuria, urgency, frequency and flank pain.  Musculoskeletal: Negative for gait problem.  Skin: Negative for rash.  Neurological: Negative for weakness and headaches.  Psychiatric/Behavioral: Negative for confusion and decreased concentration. The patient is not nervous/anxious.     Objective:  BP 126/76 mmHg  Pulse 90  Temp(Src) 98 F (36.7 C) (Oral)  Resp 18  Ht 5\' 9"  (1.753 m)  Wt 193 lb (87.544 kg)  BMI 28.49 kg/m2  SpO2 97%  Physical Exam  Constitutional: She is oriented to person, place, and time. She appears well-developed and well-nourished.  HENT:  Head: Normocephalic and atraumatic.  Eyes: Conjunctivae are normal. Pupils are equal, round, and reactive to light.  Pulmonary/Chest: Effort normal.  Musculoskeletal: She exhibits no edema.  Neurological: She is alert and oriented to person, place, and time.  Skin: Skin is dry.  Psychiatric: She has a normal mood and affect. Her behavior is normal. Thought content normal.      Assessment & Plan:   Tracy Andrade was seen today for diarrhea, emesis, weight loss and dizziness.  Diagnoses and all orders for this visit:  Type 2 diabetes mellitus without complication Orders: -     Comprehensive metabolic panel -     CBC with Differential/Platelet -     POCT glycosylated hemoglobin (Hb A1C) -     Amylase -     Lipase  Non-intractable vomiting with nausea, vomiting of unspecified type Orders: -     Comprehensive metabolic panel -     CBC with  Differential/Platelet -     POCT glycosylated hemoglobin (Hb A1C) -     Amylase -     Lipase  Diarrhea Orders: -     Comprehensive metabolic panel -     CBC with Differential/Platelet -     POCT glycosylated hemoglobin (Hb A1C) -     Amylase -     Lipase  Other orders -     Liraglutide (VICTOZA) 18 MG/3ML SOPN; 0.6 daily for one week then 1.2 for one week.  Increase to 1.8 for third week and beyond -     ondansetron (ZOFRAN-ODT) 8 MG disintegrating tablet; Take 1 tablet (8 mg total) by mouth every 8 (eight) hours as needed for nausea.   I have discontinued  Tracy Andrade metFORMIN. I am also having her start on Liraglutide and ondansetron. Additionally, I am having her maintain her emtricitabine-tenofovir, valACYclovir, lopinavir-ritonavir, raltegravir, Investigational - Study Medication, sertraline, cyclobenzaprine, buPROPion, amitriptyline, pantoprazole, lisinopril, nabumetone, UNABLE TO FIND, glucose blood, Lancets, traZODone, clonazePAM, and traMADol.  Meds ordered this encounter  Medications  . Liraglutide (VICTOZA) 18 MG/3ML SOPN    Sig: 0.6 daily for one week then 1.2 for one week.  Increase to 1.8 for third week and beyond    Dispense:  9 mL    Refill:  5  . ondansetron (ZOFRAN-ODT) 8 MG disintegrating tablet    Sig: Take 1 tablet (8 mg total) by mouth every 8 (eight) hours as needed for nausea.    Dispense:  30 tablet    Refill:  0    I asked her to stop taking the metformin and start using Victoza daily and she was given an ascending scale of medication she'll follow-up in a month with   Tracy Andrade. If her diarrhea and nausea and vomiting don't improve she can communicate with [place] again  Appropriate red flag conditions were discussed with the patient as well as actions that should be taken.  Patient expressed his understanding.  Follow-up: Return in about 4 weeks (around 10/29/2014).  Carmelina Dane, MD

## 2014-10-01 NOTE — Telephone Encounter (Signed)
Pt called. Says one of her medicines was too expensive at [facility] and was wondering if we could switch it to [facility]. Telephone operator did not inquire as to which medicine. Tried to call pt back and there was no answer/no VM

## 2014-10-01 NOTE — Patient Instructions (Signed)

## 2014-10-01 NOTE — Telephone Encounter (Signed)
I did not send insulin.  I sent Victoza and her insurance DOES cover it.

## 2014-10-01 NOTE — Telephone Encounter (Signed)
Pt called back regarding her last phone call.  Pt wanted to know if we could change her medications of Zofran and Victoza from [facility] to [facility] in [place], [place].  Pt states that they were too expensive at [facility].  I advised pt that it would be no problem.  [facility] was called and med's were cancelled. Medications were [facility] to [facility].  Pt was notified.

## 2014-10-01 NOTE — Addendum Note (Signed)
Addended by: Isaac BlissGALLOWAY, Alvis Edgell J on: 10/01/2014 12:46 PM   Modules accepted: Orders

## 2014-10-02 ENCOUNTER — Encounter: Payer: Self-pay | Admitting: Family Medicine

## 2014-10-05 ENCOUNTER — Telehealth: Payer: Self-pay | Admitting: *Deleted

## 2014-10-05 NOTE — Telephone Encounter (Signed)
Called and LM w/details about what I found out about PA. Asked her to CB if there is still a problem after pharm contacts ins.

## 2014-10-05 NOTE — Telephone Encounter (Signed)
I received a PA form from pharm and called # on fax. Ins co stated that MD office can not do this, the pharm must call [phone], ext # [phone] and speak to Crisoforo Oxfordolleen Hicks to process this Rx. I faxed back this info to pharm.

## 2014-10-05 NOTE — Telephone Encounter (Signed)
Spoke with pt and appt for 10-08-14 at 3:00 p.m. With [place] made.  PV cancelled.  Pt told to bring medication list and insurance card

## 2014-10-05 NOTE — Telephone Encounter (Signed)
Received: Today    Tracy FiedlerJay M Pyrtle, MD  Tracy SallesKristen B Westbrook, RN    Cc: Tracy NoseLinda R Hunt, RN            OV with APP is good idea  Thanks  JMP       Previous Messages     ----- Message -----   From: Tracy SallesKristen B Westbrook, RN   Sent: 10/05/2014  9:18 AM    To: Tracy FiedlerJay M Pyrtle, MD   Dr. Rhea BeltonPyrtle,   Please review this pt's OV with her PCP on 10-01-14. Given her diarrhea, nausea and weight loss, would you like her to have OV with an extender or just a PV as scheduled? She is coming in on 10-07-14 for a screening colonoscopy on 10-21-14?  Just wanted to run this by you given her recent problems  Thanks,   Tracy         No morning appointments available with any extender before colonoscopy.  I spoke with Tracy RuaSheri, who gave ok to schedule appt with Tracy FusiPaula on 10-08-14, in any available afternoon openings Attempted to reach pt at [phone]- no ID on machine, so I just left my name and number for pt to call back.

## 2014-10-07 ENCOUNTER — Encounter: Payer: Self-pay | Admitting: Gastroenterology

## 2014-10-08 ENCOUNTER — Encounter: Payer: Self-pay | Admitting: Nurse Practitioner

## 2014-10-08 ENCOUNTER — Ambulatory Visit (INDEPENDENT_AMBULATORY_CARE_PROVIDER_SITE_OTHER): Payer: BLUE CROSS/BLUE SHIELD | Admitting: Nurse Practitioner

## 2014-10-08 VITALS — BP 104/70 | HR 72 | Ht 69.5 in | Wt 198.2 lb

## 2014-10-08 DIAGNOSIS — Z1211 Encounter for screening for malignant neoplasm of colon: Secondary | ICD-10-CM

## 2014-10-08 DIAGNOSIS — R197 Diarrhea, unspecified: Secondary | ICD-10-CM

## 2014-10-08 DIAGNOSIS — R112 Nausea with vomiting, unspecified: Secondary | ICD-10-CM | POA: Diagnosis not present

## 2014-10-08 NOTE — Patient Instructions (Signed)
You have been scheduled for a colonoscopy. Please follow written instructions given to you at your visit today.  Please pick up your prep supplies at the pharmacy within the next 1-3 days. [facility], [address]  If you use inhalers (even only as needed), please bring them with you on the day of your procedure. Your physician has requested that you go to www.startemmi.com and enter the access code given to you at your visit today. This web site gives a general overview about your procedure. However, you should still follow specific instructions given to you by our office regarding your preparation for the procedure. 

## 2014-10-10 ENCOUNTER — Encounter: Payer: Self-pay | Admitting: Nurse Practitioner

## 2014-10-10 DIAGNOSIS — Z1211 Encounter for screening for malignant neoplasm of colon: Secondary | ICD-10-CM | POA: Insufficient documentation

## 2014-10-10 DIAGNOSIS — R112 Nausea with vomiting, unspecified: Secondary | ICD-10-CM | POA: Insufficient documentation

## 2014-10-10 DIAGNOSIS — R197 Diarrhea, unspecified: Secondary | ICD-10-CM | POA: Insufficient documentation

## 2014-10-10 NOTE — Progress Notes (Addendum)
HPI :  Patient is a pleasant 54 year old female who is scheduled for her first screening colonoscopy. Patient was supposed to be a direct colonoscopy but because of recent nausea, vomiting and diarrhea patient was made an appointment with me. Her GI symptoms all resolved several days ago. Patient thinks symptoms related to starting Metformin. Though still on the medication patient feels her body has now adjusted.   Past Medical History  Diagnosis Date  . Arthritis   . HIV infection   . Depression   . Anxiety   . Insomnia   . GERD (gastroesophageal reflux disease)   . Eczema   . Herpes simplex type 2 infection   . DM (diabetes mellitus)     Family History  Problem Relation Age of Onset  . Cancer Maternal Grandmother   . Hypertension Maternal Grandfather   . Lung cancer Mother     remission as of 06/2013   History  Substance Use Topics  . Smoking status: Former Games developer  . Smokeless tobacco: Never Used  . Alcohol Use: No   Current Outpatient Prescriptions  Medication Sig Dispense Refill  . amitriptyline (ELAVIL) 100 MG tablet Take 1 tablet (100 mg total) by mouth at bedtime. 30 tablet 5  . buPROPion (WELLBUTRIN XL) 150 MG 24 hr tablet Take 1 tablet (150 mg total) by mouth daily. 30 tablet 5  . clonazePAM (KLONOPIN) 2 MG tablet TAKE 2 TABS AT BEDTIME AND ONE ONCE A DAY AS NEEDED 90 tablet 0  . cyclobenzaprine (FLEXERIL) 10 MG tablet Take 1 tablet (10 mg total) by mouth 3 (three) times daily as needed for muscle spasms. 30 tablet 5  . emtricitabine-tenofovir (TRUVADA) 200-300 MG per tablet Take 1 tablet by mouth daily.    Marland Kitchen glucose blood test strip Use as instructed 100 each 12  . Investigational - Study Medication Take by mouth.    . Lancets MISC Use to test blood sugar as directed. 100 each 12  . lisinopril (PRINIVIL,ZESTRIL) 2.5 MG tablet Take 1 tablet (2.5 mg total) by mouth daily. 30 tablet 5  . lopinavir-ritonavir (KALETRA) 200-50 MG per tablet 2 tablets. Take 2 tablets  by mouth 2 times daily.    . metFORMIN (GLUCOPHAGE) 1000 MG tablet Take 1,000 mg by mouth 2 (two) times daily with a meal.    . nabumetone (RELAFEN) 500 MG tablet Take 1-2 tablets (500-1,000 mg total) by mouth 2 (two) times daily as needed. 120 tablet 5  . ondansetron (ZOFRAN-ODT) 8 MG disintegrating tablet Take 1 tablet (8 mg total) by mouth every 8 (eight) hours as needed for nausea. 30 tablet 0  . raltegravir (ISENTRESS) 400 MG tablet 400 mg. Take 1 tablet (400 mg total) by mouth 2 times daily.    . sertraline (ZOLOFT) 100 MG tablet Take 1.5 tablets (150 mg total) by mouth daily. 45 tablet 5  . traMADol (ULTRAM) 50 MG tablet TAKE ONE TABLET BY MOUTH EVERY 8 HOURS AS NEEDED 30 tablet 0  . traZODone (DESYREL) 100 MG tablet TAKE 2 TABLETS BY MOUTH EVERY NIGHT AT BEDTIME. 60 tablet 0  . UNABLE TO FIND Blood glucose meter (which ever meter insurance will pay for) 1 Device 0  . valACYclovir (VALTREX) 1000 MG tablet 1,000 mg. Take 1 tablet (1,000 mg total) by mouth daily.     No current facility-administered medications for this visit.   No Known Allergies   Review of Systems: All systems reviewed and negative except where noted in HPI.  Physical Exam: BP 104/70 mmHg  Pulse 72  Ht 5' 9.5" (1.765 m)  Wt 198 lb 3.2 oz (89.903 kg)  BMI 28.86 kg/m2 Constitutional: Pleasant,well-developed, black female in no acute distress. Cardiovascular: Normal rate, regular rhythm.  Pulmonary/chest: Effort normal and breath sounds normal. No wheezing, rales or rhonchi.  ASSESSMENT AND PLAN: 75. 54 year old female for colon cancer screening. The risks, benefits, and alternatives to colonoscopy with possible biopsy and possible polypectomy were discussed with the patient and she consents to proceed.   2. Nausea, vomiting, and diarrhea. Patient believes her body was adjusting to recently started metformin. She could be correct or may this was viral. At any rate, no Gi symptoms in several days now.   3. HIV,  follow[facility] at [facility].   Addendum: Reviewed and agree with initial management. Beverley Fiedler, MD

## 2014-10-12 ENCOUNTER — Other Ambulatory Visit: Payer: Self-pay | Admitting: Physician Assistant

## 2014-10-14 ENCOUNTER — Telehealth: Payer: Self-pay | Admitting: Nurse Practitioner

## 2014-10-14 ENCOUNTER — Other Ambulatory Visit: Payer: Self-pay | Admitting: *Deleted

## 2014-10-14 MED ORDER — NA SULFATE-K SULFATE-MG SULF 17.5-3.13-1.6 GM/177ML PO SOLN: ORAL | Status: DC

## 2014-10-14 NOTE — Telephone Encounter (Signed)
Sent prescription for Suprep to [address]

## 2014-10-15 NOTE — Telephone Encounter (Signed)
Called and LM for the patient that we just received samples of the prep today.  I put one at our front desk for her. I asked she pick it up today or tomorrow.

## 2014-10-21 ENCOUNTER — Encounter: Payer: Self-pay | Admitting: Internal Medicine

## 2014-10-21 ENCOUNTER — Ambulatory Visit (AMBULATORY_SURGERY_CENTER): Payer: BLUE CROSS/BLUE SHIELD | Admitting: Internal Medicine

## 2014-10-21 VITALS — BP 115/57 | HR 76 | Temp 97.2°F | Resp 20 | Ht 69.5 in | Wt 198.0 lb

## 2014-10-21 DIAGNOSIS — Z1211 Encounter for screening for malignant neoplasm of colon: Secondary | ICD-10-CM | POA: Diagnosis not present

## 2014-10-21 MED ORDER — SODIUM CHLORIDE 0.9 % IV SOLN: 500.0000 mL | INTRAVENOUS | Status: DC

## 2014-10-21 NOTE — Progress Notes (Signed)
Transferred to recovery room. A/O x3, pleased with MAC.  VSS.  Report to June, RN. 

## 2014-10-21 NOTE — Patient Instructions (Signed)
YOU HAD AN ENDOSCOPIC PROCEDURE TODAY AT THE [facility]:   Refer to the procedure report that was given to you for any specific questions about what was found during the examination.  If the procedure report does not answer your questions, please call your gastroenterologist to clarify.  If you requested that your care partner not be given the details of your procedure findings, then the procedure report has been included in a sealed envelope for you to review at your convenience later.  YOU SHOULD EXPECT: Some feelings of bloating in the abdomen. Passage of more gas than usual.  Walking can help get rid of the air that was put into your GI tract during the procedure and reduce the bloating. If you had a lower endoscopy (such as a colonoscopy or flexible sigmoidoscopy) you may notice spotting of blood in your stool or on the toilet paper. If you underwent a bowel prep for your procedure, you may not have a normal bowel movement for a few days.  Please Note:  You might notice some irritation and congestion in your nose or some drainage.  This is from the oxygen used during your procedure.  There is no need for concern and it should clear up in a day or so.  SYMPTOMS TO REPORT IMMEDIATELY:   Following lower endoscopy (colonoscopy or flexible sigmoidoscopy):  Excessive amounts of blood in the stool  Significant tenderness or worsening of abdominal pains  Swelling of the abdomen that is new, acute  Fever of 100F or higher   For urgent or emergent issues, a gastroenterologist can be reached at any hour by calling [phone].   DIET: Your first meal following the procedure should be a small meal and then it is ok to progress to your normal diet. Heavy or fried foods are harder to digest and may make you feel nauseous or bloated.  Likewise, meals heavy in dairy and vegetables can increase bloating.  Drink plenty of fluids but you should avoid alcoholic beverages for 24  hours.  ACTIVITY:  You should plan to take it easy for the rest of today and you should NOT DRIVE or use heavy machinery until tomorrow (because of the sedation medicines used during the test).    FOLLOW UP: Our staff will call the number listed on your records the next business day following your procedure to check on you and address any questions or concerns that you may have regarding the information given to you following your procedure. If we do not reach you, we will leave a message.  However, if you are feeling well and you are not experiencing any problems, there is no need to return our call.  We will assume that you have returned to your regular daily activities without incident.  If any biopsies were taken you will be contacted by phone or by letter within the next 1-3 weeks.  Please call us at [phone] if you have not heard about the biopsies in 3 weeks.    SIGNATURES/CONFIDENTIALITY: You and/or your care partner have signed paperwork which will be entered into your electronic medical record.  These signatures attest to the fact that that the information above on your After Visit Summary has been reviewed and is understood.  Full responsibility of the confidentiality of this discharge information lies with you and/or your care-partner. 

## 2014-10-21 NOTE — Op Note (Signed)
[facility] [address] [place], [address]   COLONOSCOPY PROCEDURE REPORT  PATIENT: Tracy Andrade, Tracy Andrade  MR#: [id number] BIRTHDATE: 26-Apr-1960 , 54  yrs. old GENDER: female ENDOSCOPIST: Beverley Fiedler, MD REFERRED XB:JYNWGN Tinnie Gens, [place] PROCEDURE DATE:  10/21/2014 PROCEDURE:   Colonoscopy, screening First Screening Colonoscopy - Avg.  risk and is 50 yrs.  old or older Yes.  Prior Negative Screening - Now for repeat screening. N/A  History of Adenoma - Now for follow-up colonoscopy & has been > or = to 3 yrs.  N/A  Polyps removed today? No Recommend repeat exam, <10 yrs? No Polyps removed today? No ASA CLASS:   Class III INDICATIONS:Screening for colonic neoplasia and Colorectal Neoplasm Risk Assessment for this procedure is average risk. MEDICATIONS: Monitored anesthesia care and Propofol 250 mg IV  DESCRIPTION OF PROCEDURE:   After the risks benefits and alternatives of the procedure were thoroughly explained, informed consent was obtained.  The digital rectal exam revealed no abnormalities of the rectum.   The [id number]  endoscope was introduced through the anus and advanced to the cecum, which was identified by both the appendix and ileocecal valve. No adverse events experienced.   The quality of the prep was fair requiring copious irrigation lavage.  (Suprep was used)  The instrument was then slowly withdrawn as the colon was fully examined. Estimated blood loss is zero unless otherwise noted in this procedure report.      COLON FINDINGS: A normal appearing cecum, ileocecal valve, and appendiceal orifice were identified.  the ascending, transverse, descending, sigmoid colon, and rectum appeared unremarkable. Retroflexion was not performed due to a narrow rectal vault. The time to cecum = 8.6 Withdrawal time = 11.9   The scope was withdrawn and the procedure completed.  COMPLICATIONS: There were no complications.  ENDOSCOPIC IMPRESSION: Normal  colonoscopy  RECOMMENDATIONS: You should continue to follow colorectal cancer screening guidelines for "routine risk" patients with a repeat colonoscopy in 10 years. There is no need for FOBT (stool) testing for at least 5 years.  eSigned:  Beverley Fiedler, MD 10/21/2014 9:49 AM   cc: Theora Gianotti, PA and The Patient

## 2014-10-22 ENCOUNTER — Telehealth: Payer: Self-pay | Admitting: Emergency Medicine

## 2014-10-22 NOTE — Telephone Encounter (Signed)
Left message per f/u, no indentifier

## 2014-10-29 NOTE — Telephone Encounter (Signed)
Patient had procedure 10/21/14

## 2014-10-30 ENCOUNTER — Other Ambulatory Visit: Payer: Self-pay | Admitting: Physician Assistant

## 2014-10-31 NOTE — Telephone Encounter (Signed)
Done

## 2014-11-05 ENCOUNTER — Ambulatory Visit: Payer: BLUE CROSS/BLUE SHIELD | Admitting: Physician Assistant

## 2014-11-09 ENCOUNTER — Other Ambulatory Visit: Payer: Self-pay | Admitting: Physician Assistant

## 2014-11-16 ENCOUNTER — Ambulatory Visit (INDEPENDENT_AMBULATORY_CARE_PROVIDER_SITE_OTHER): Payer: BLUE CROSS/BLUE SHIELD | Admitting: Physician Assistant

## 2014-11-16 VITALS — BP 122/82 | HR 102 | Temp 97.9°F | Resp 16 | Ht 69.5 in | Wt 189.0 lb

## 2014-11-16 DIAGNOSIS — Z23 Encounter for immunization: Secondary | ICD-10-CM | POA: Diagnosis not present

## 2014-11-16 DIAGNOSIS — F32A Depression, unspecified: Secondary | ICD-10-CM

## 2014-11-16 DIAGNOSIS — E669 Obesity, unspecified: Secondary | ICD-10-CM

## 2014-11-16 DIAGNOSIS — F418 Other specified anxiety disorders: Secondary | ICD-10-CM

## 2014-11-16 DIAGNOSIS — R112 Nausea with vomiting, unspecified: Secondary | ICD-10-CM | POA: Diagnosis not present

## 2014-11-16 DIAGNOSIS — F329 Major depressive disorder, single episode, unspecified: Secondary | ICD-10-CM

## 2014-11-16 DIAGNOSIS — E119 Type 2 diabetes mellitus without complications: Secondary | ICD-10-CM | POA: Diagnosis not present

## 2014-11-16 DIAGNOSIS — G4700 Insomnia, unspecified: Secondary | ICD-10-CM

## 2014-11-16 DIAGNOSIS — F419 Anxiety disorder, unspecified: Secondary | ICD-10-CM

## 2014-11-16 DIAGNOSIS — R197 Diarrhea, unspecified: Secondary | ICD-10-CM

## 2014-11-16 MED ORDER — METFORMIN HCL ER 500 MG PO TB24: 1000.0000 mg | ORAL_TABLET | Freq: Every day | ORAL | Status: DC

## 2014-11-16 NOTE — Progress Notes (Signed)
Patient ID: Tracy Andrade, female    DOB: [birth year], 54 y.o.   MRN: [id number]  PCP: Olene Floss  Subjective:   Chief Complaint  Patient presents with  . Follow-up    Tracy Andrade  . Diarrhea    x 1 month  . Emesis    x 1 month  . Flu Vaccine    HPI Presents for evaluation of diarrhea and diabetes.  At her visit 7/14, the metformin was D/C'd and she was started on Victoza, but she never made the changes because she didn't want to self-inject. Hgb A1C was 8.8%  Has made some dietary changes. Cut out sodas and chocolate (it causes hot flashes). Her colleagues are supportive by watching her eating choices. Coffee also increases her BMs, so she's cut back a lot on it.  Notes that her food choices contribute to her symptoms of nausea/vomiting. Metformin is causing diarrhea, sometimes uncontrolled, with increased urgency. But she has lost some weight, which she's really happy with, and notes that her blood sugars are improving. 145 this AM before breakfast. None >200. Lowest 90. Post-prandial readings all below 200.  Colonoscopy went well. Repeat in 10 years.  Sleeping well. Mood is good. Feels happy.   Review of Systems  Constitutional: Negative.   Eyes: Negative for visual disturbance.  Respiratory: Negative for cough, chest tightness and shortness of breath.   Cardiovascular: Negative for chest pain, palpitations and leg swelling.  Gastrointestinal: Positive for nausea, vomiting and diarrhea. Negative for abdominal pain, constipation, blood in stool, abdominal distention, anal bleeding and rectal pain.  Endocrine: Negative.   Genitourinary: Negative for dysuria, urgency, frequency and hematuria.  Skin: Negative for rash.  Allergic/Immunologic: Positive for immunocompromised state.  Neurological: Negative for dizziness, weakness and headaches.  Psychiatric/Behavioral: Negative for sleep disturbance.       Patient Active Problem List   Diagnosis Date Noted  .  Colon cancer screening 10/10/2014  . Nausea with vomiting 10/10/2014  . Diarrhea 10/10/2014  . Herpes simplex type 2 infection 08/06/2014  . Type 2 diabetes mellitus without complication 08/06/2014  . Obesity (BMI 30-39.9) 07/12/2013  . Post-menopausal 01/24/2013  . Hyperglycemia 01/24/2013  . Eczema 01/24/2013  . Stress incontinence 01/24/2013  . HIV disease 05/10/2012  . Insomnia 05/10/2012  . Anxiety and depression 05/10/2012  . Chronic back pain 05/10/2012  . Condyloma acuminata 05/10/2012  . 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 GERD (gastroesophageal reflux disease) 02/02/2011     Prior to Admission medications   Medication Sig Start Date End Date Taking? Authorizing Provider  amitriptyline (ELAVIL) 100 MG tablet Take 1 tablet (100 mg total) by mouth at bedtime. 08/06/14  Yes Saveon Plant, PA-C  buPROPion (WELLBUTRIN XL) 150 MG 24 hr tablet Take 1 tablet (150 mg total) by mouth daily. 08/06/14  Yes Priyansh Pry, PA-C  clonazePAM (KLONOPIN) 2 MG tablet TAKE TWO TABLETS BY MOUTH AT BEDTIME AND ONE ONCE DAILY AS NEEDED 10/31/14  Yes Morrell Riddle, PA-C  cyclobenzaprine (FLEXERIL) 10 MG tablet Take 1 tablet (10 mg total) by mouth 3 (three) times daily as needed for muscle spasms. 08/06/14  Yes Araceli Arango, PA-C  emtricitabine-tenofovir (TRUVADA) 200-300 MG per tablet Take 1 tablet by mouth daily.   Yes Historical Provider, MD  glucose blood test strip Use as instructed 08/25/14  Yes Porfirio Oar, PA-C  Investigational - Study Medication Take by mouth.   Yes Historical Provider, MD  Lancets MISC Use to test blood sugar as directed. 08/25/14  Yes Lanaya Bennis  Dartha Rozzell, PA-C  lisinopril (PRINIVIL,ZESTRIL) 2.5 MG tablet Take 1 tablet (2.5 mg total) by mouth daily. 08/06/14  Yes Minah Axelrod, PA-C  lopinavir-ritonavir (KALETRA) 200-50 MG per tablet 2 tablets. Take 2 tablets by mouth 2 times daily. 10/16/11  Yes Historical Provider, MD  metFORMIN (GLUCOPHAGE) 1000 MG tablet Take 1,000 mg by mouth 2  (two) times daily with a meal.   Yes Historical Provider, MD  nabumetone (RELAFEN) 500 MG tablet Take 1-2 tablets (500-1,000 mg total) by mouth 2 (two) times daily as needed. 08/06/14  Yes Andre Swander, PA-C  ondansetron (ZOFRAN-ODT) 8 MG disintegrating tablet Take 1 tablet (8 mg total) by mouth every 8 (eight) hours as needed for nausea. 10/01/14  Yes Carmelina Dane, MD  raltegravir (ISENTRESS) 400 MG tablet 400 mg. Take 1 tablet (400 mg total) by mouth 2 times daily. 10/16/11  Yes Historical Provider, MD  sertraline (ZOLOFT) 100 MG tablet Take 1.5 tablets (150 mg total) by mouth daily. 08/06/14  Yes Keneisha Heckart, PA-C  traMADol (ULTRAM) 50 MG tablet TAKE ONE TABLET BY MOUTH EVERY 8 HOURS AS NEEDED 10/31/14  Yes Morrell Riddle, PA-C  traZODone (DESYREL) 100 MG tablet TAKE 2 TABLETS BY MOUTH EVERY NIGHT AT BEDTIME.  "NO MORE REFILLS WITHOUT OV" 2ND 11/10/14  Yes Sandeep Radell, PA-C  UNABLE TO FIND Blood glucose meter (which ever meter insurance will pay for) 08/21/14  Yes Nicholaus Steinke, PA-C  valACYclovir (VALTREX) 1000 MG tablet 1,000 mg. Take 1 tablet (1,000 mg total) by mouth daily. 10/16/11  Yes Historical Provider, MD     No Known Allergies     Objective:  Physical Exam  Constitutional: She is oriented to person, place, and time. She appears well-developed and well-nourished. No distress.  BP 122/82 mmHg  Pulse 102  Temp(Src) 97.9 F (36.6 C) (Oral)  Resp 16  Ht 5' 9.5" (1.765 m)  Wt 189 lb (85.73 kg)  BMI 27.52 kg/m2  SpO2 98%   Eyes: Conjunctivae are normal. No scleral icterus.  Neck: Neck supple. No thyromegaly present.  Cardiovascular: Normal rate, regular rhythm, normal heart sounds and intact distal pulses.   Pulmonary/Chest: Effort normal and breath sounds normal.  Lymphadenopathy:    She has no cervical adenopathy.  Neurological: She is alert and oriented to person, place, and time.  Skin: Skin is warm and dry.  Psychiatric: She has a normal mood and affect. Her  speech is normal and behavior is normal.   Diabetic Foot Exam - Simple   Simple Foot Form  Diabetic Foot exam was performed with the following findings:  Yes 11/16/2014 12:05 PM  Visual Inspection  No deformities, no ulcerations, no other skin breakdown bilaterally:  Yes  Sensation Testing  Intact to touch and monofilament testing bilaterally:  Yes  Pulse Check  Posterior Tibialis and Dorsalis pulse intact bilaterally:  Yes  Comments             Assessment & Plan:   1. Type 2 diabetes mellitus without complication Not controlled. First, try switching to extended release metformin. May need to increase dose to 2000 mg daily, if she tolerates it. If not, reduce (perhaps she can tolerate even 500 mg?) or D/C and add another agent-oral. She will not inject herself at this point, though we discussed that some of the newer medications are injectables. Continue her efforts for weight loss with healthier eating choices and regular exercise. - metFORMIN (GLUCOPHAGE-XR) 500 MG 24 hr tablet; Take 2 tablets (1,000 mg total) by mouth  daily with breakfast.  Dispense: 180 tablet; Refill: 3 - Microalbumin, urine  2. Diarrhea See above.  3. Non-intractable vomiting with nausea, vomiting of unspecified type I suspect that this is reflux, as it tends to occur with meals higher in fat. Lifestyle changes as above.  4. Anxiety and depression Controlled on current treatment.  5. Insomnia Controlled on current treatment.  6. Obesity (BMI 30-39.9) See above.  7. Need for influenza vaccination - Flu Vaccine QUAD 36+ mos IM  8. Need for pneumococcal vaccination We suspect that she's had the Pneumovax-23, but will pull her chart and verify. - Pneumococcal conjugate vaccine 13-valent IM   Fernande Bras, PA-C Physician Assistant-Certified [facility]

## 2014-11-16 NOTE — Patient Instructions (Addendum)
Keep working on healthy eating and increasing your exercise. Keep a check on your glucose as we make the change from the immediate release metformin to the extended release. If the diarrhea continues, or if your blood sugar readings increase, let me know. We'll plan to stop the metformin and start something else (an oral medication, since you don't want to inject anything).  Please schedule a mammogram and an eye exam.

## 2014-11-17 ENCOUNTER — Encounter: Payer: Self-pay | Admitting: Physician Assistant

## 2014-11-17 LAB — MICROALBUMIN, URINE: Microalb, Ur: 1.4 mg/dL (ref <2.0)

## 2014-11-19 NOTE — Progress Notes (Signed)
  Medical screening examination/treatment/procedure(s) were performed by non-physician practitioner and as supervising physician I was immediately available for consultation/collaboration.     

## 2014-11-28 ENCOUNTER — Other Ambulatory Visit: Payer: Self-pay | Admitting: Physician Assistant

## 2014-12-01 NOTE — Telephone Encounter (Signed)
Faxed

## 2014-12-07 ENCOUNTER — Other Ambulatory Visit: Payer: Self-pay | Admitting: Physician Assistant

## 2014-12-18 ENCOUNTER — Other Ambulatory Visit: Payer: Self-pay | Admitting: Physician Assistant

## 2014-12-21 ENCOUNTER — Telehealth: Payer: Self-pay | Admitting: Family Medicine

## 2014-12-21 NOTE — Telephone Encounter (Signed)
Left a message for patient to return call to schedule a mammogram or see if she has had it, and if so where and when>?

## 2015-01-08 ENCOUNTER — Other Ambulatory Visit: Payer: Self-pay | Admitting: Physician Assistant

## 2015-01-14 ENCOUNTER — Other Ambulatory Visit: Payer: Self-pay | Admitting: Physician Assistant

## 2015-01-17 NOTE — Telephone Encounter (Signed)
rx printed.  Meds ordered this encounter  Medications  . clonazePAM (KLONOPIN) 2 MG tablet    Sig: TAKE TWO TABLETS BY MOUTH AT BEDTIME AND ONE ONCE DAILY AS NEEDED    Dispense:  90 tablet    Refill:  0

## 2015-01-18 NOTE — Telephone Encounter (Signed)
Faxed

## 2015-01-25 ENCOUNTER — Other Ambulatory Visit: Payer: Self-pay | Admitting: Physician Assistant

## 2015-02-06 ENCOUNTER — Other Ambulatory Visit: Payer: Self-pay | Admitting: Physician Assistant

## 2015-02-16 ENCOUNTER — Other Ambulatory Visit: Payer: Self-pay | Admitting: Physician Assistant

## 2015-02-16 NOTE — Telephone Encounter (Signed)
Rx printed at 104. Will bring to 102 after clinic.  Meds ordered this encounter  Medications  . clonazePAM (KLONOPIN) 2 MG tablet    Sig: TAKE TWO TABLETS BY MOUTH AT BEDTIME AND ONE ONCE DAILY AS NEEDED    Dispense:  90 tablet    Refill:  0

## 2015-02-16 NOTE — Telephone Encounter (Signed)
LMVM for patient that RX is ready for pickup at [address] building or can be called in at her request.  Advised patient to CB to let us know what she wants us to do.

## 2015-02-23 ENCOUNTER — Other Ambulatory Visit: Payer: Self-pay | Admitting: Physician Assistant

## 2015-03-08 ENCOUNTER — Other Ambulatory Visit: Payer: Self-pay | Admitting: Physician Assistant

## 2015-03-22 ENCOUNTER — Other Ambulatory Visit: Payer: Self-pay | Admitting: Physician Assistant

## 2015-03-23 NOTE — Telephone Encounter (Signed)
Rx printed at 104. Will bring to 102 after clinic.  Meds ordered this encounter  Medications  . clonazePAM (KLONOPIN) 2 MG tablet    Sig: TAKE 2 TABS AT BEDTIME AND 1 ONCE A DAY AS NEEDED    Dispense:  90 tablet    Refill:  0    Please remind patient that I need to see her in February for follow-up.

## 2015-03-24 ENCOUNTER — Other Ambulatory Visit: Payer: Self-pay

## 2015-03-24 MED ORDER — SERTRALINE HCL 100 MG PO TABS: ORAL_TABLET | ORAL | Status: DC

## 2015-03-24 NOTE — Telephone Encounter (Signed)
Chelle had refilled pt's clonazepam w/instr's that pt needs to f/up in Feb for more RFs. Called and advised pt of this and she stated that she lost her job and ins. I connected her to Billing dept to advise on OV costs when paying cash. I am sending in a mos Rf of the sertraline also, clarified correct pharm.

## 2015-03-25 NOTE — Telephone Encounter (Signed)
Got a fax that pharm did not receive this,  I called in RF.

## 2015-04-16 ENCOUNTER — Other Ambulatory Visit: Payer: Self-pay | Admitting: Physician Assistant

## 2015-04-26 ENCOUNTER — Other Ambulatory Visit: Payer: Self-pay | Admitting: Physician Assistant

## 2015-04-26 DIAGNOSIS — Z1231 Encounter for screening mammogram for malignant neoplasm of breast: Secondary | ICD-10-CM

## 2015-04-27 ENCOUNTER — Other Ambulatory Visit: Payer: Self-pay | Admitting: Physician Assistant

## 2015-04-27 NOTE — Telephone Encounter (Signed)
Rx printed at 104. Will bring to 102 after clinic.  Meds ordered this encounter  Medications  . clonazePAM (KLONOPIN) 2 MG tablet    Sig: TAKE TWO TABLETS BY MOUTH AT BEDTIME AND ONE ONCE DAILY AS NEEDED    Dispense:  90 tablet    Refill:  0     

## 2015-04-27 NOTE — Telephone Encounter (Signed)
PATIENT WOULD LIKE CHELLE TO KNOW THAT HER INSURANCE DOES NOT GO INTO EFFECT UNTIL May 19, 2015. SHE WILL COME INTO THE WALK-IN TO SEE HER ON THURS. May 20, 2015. IN THE MEANTIME, PATIENT STATES SHE NEEDS TO GET A REFILL ON HER KLONOPIN 2 MG. SHE WAS OFF OF IT BEFORE AND WENT INTO WITHDRAWALS AND SHE DOES NOT WANT TO DO THAT AGAIN. SHE SAID SHE HAS ALREADY CALLED HER PHARMACY. BEST PHONE [phone] (CELL)  PHARMACY CHOICE IS [facility]   MBC

## 2015-04-28 NOTE — Telephone Encounter (Signed)
Notified pt RF was faxed. 

## 2015-05-07 ENCOUNTER — Other Ambulatory Visit: Payer: Self-pay | Admitting: Physician Assistant

## 2015-05-11 ENCOUNTER — Telehealth: Payer: Self-pay

## 2015-05-11 NOTE — Telephone Encounter (Signed)
The patient is requesting a prescription or OTC recommendation for a persistent cough.  She said every time she coughs, she pees.  She has been taking Robitussin and Tylenol, but they are not working.  She said she typically sees Chelle.  Please advise, thank you.  CB#: [phone]

## 2015-05-12 NOTE — Telephone Encounter (Signed)
Left a message for patient to return call.  She will need to come in and be seen.  We have not treated her for this issue before.

## 2015-05-19 ENCOUNTER — Ambulatory Visit (INDEPENDENT_AMBULATORY_CARE_PROVIDER_SITE_OTHER): Payer: BLUE CROSS/BLUE SHIELD | Admitting: Physician Assistant

## 2015-05-19 ENCOUNTER — Ambulatory Visit (INDEPENDENT_AMBULATORY_CARE_PROVIDER_SITE_OTHER): Payer: BLUE CROSS/BLUE SHIELD

## 2015-05-19 ENCOUNTER — Encounter: Payer: Self-pay | Admitting: Physician Assistant

## 2015-05-19 VITALS — BP 126/74 | HR 105 | Temp 97.8°F | Resp 18 | Ht 69.0 in | Wt 181.0 lb

## 2015-05-19 DIAGNOSIS — R52 Pain, unspecified: Secondary | ICD-10-CM | POA: Diagnosis not present

## 2015-05-19 DIAGNOSIS — R059 Cough, unspecified: Secondary | ICD-10-CM

## 2015-05-19 DIAGNOSIS — R05 Cough: Secondary | ICD-10-CM

## 2015-05-19 LAB — POCT INFLUENZA A/B
INFLUENZA A, POC: NEGATIVE
INFLUENZA B, POC: NEGATIVE

## 2015-05-19 MED ORDER — HYDROCOD POLST-CPM POLST ER 10-8 MG/5ML PO SUER: 5.0000 mL | Freq: Two times a day (BID) | ORAL | Status: DC | PRN

## 2015-05-19 MED ORDER — GUAIFENESIN ER 1200 MG PO TB12: 1.0000 | ORAL_TABLET | Freq: Two times a day (BID) | ORAL | Status: DC | PRN

## 2015-05-19 MED ORDER — AMOXICILLIN-POT CLAVULANATE 875-125 MG PO TABS: 1.0000 | ORAL_TABLET | Freq: Two times a day (BID) | ORAL | Status: DC

## 2015-05-19 NOTE — Patient Instructions (Addendum)
Because you received an x-ray today, you will receive an invoice from [facility]. Please contact [facility] at [phone] with questions or concerns regarding your invoice. Our billing staff will not be able to assist you with those questions.  Get plenty of rest and drink at least 64 ounces of water daily.

## 2015-05-19 NOTE — Progress Notes (Signed)
Patient ID: Tracy Andrade, female    DOB: [birth year], 54 y.o.   MRN: [id number]  PCP: Wynne Dust  Subjective:   Chief Complaint  Patient presents with  . Cough  . Headache  . Generalized Body Aches    vomiting    HPI Presents for evaluation of illness x 2 weeks.  Initially had diarrhea, nausea and vomiting, coughing. Chills. Most of the emesis occurred following hard coughing. Stool and urine incontinence with coughing, sometimes even while asleep. Feels like she can see her pills in her stool, which is very green in color. Cough initially produced white/clear phlegm, 2 days ago sputum turned green. Hot flashes vs fever, she's not really sure.  OTC Robitussin and a decongestant have been minimally and temporarily helpful.  Body aches began 2 days ago. Nasal congestion began yesterday. Headache began yesterday. LEFT sided facial pain began yesterday. No rash.  Sick contacts at work. Received a flu vaccine this season.  She is HIV positive. Care at [facility] Viral load undetectable at last draw 11/2014.  Review of Systems As above.    Patient Active Problem List   Diagnosis Date Noted  . Colon cancer screening 10/10/2014  . Nausea with vomiting 10/10/2014  . Diarrhea 10/10/2014  . Herpes simplex type 2 infection 08/06/2014  . Type 2 diabetes mellitus without complication ([place]) 53/66/4403  . Obesity (BMI 30-39.9) 07/12/2013  . Post-menopausal 01/24/2013  . Hyperglycemia 01/24/2013  . Eczema 01/24/2013  . Stress incontinence 01/24/2013  . HIV disease ([place]) 05/10/2012  . Insomnia 05/10/2012  . Anxiety and depression 05/10/2012  . Chronic back pain 05/10/2012  . Condyloma acuminata 05/10/2012  . 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 GERD (gastroesophageal reflux disease) 02/02/2011     Prior to Admission medications   Medication Sig Start Date End Date Taking? Authorizing Provider  amitriptyline (ELAVIL) 100 MG tablet TAKE 1 TABLET BY MOUTH AT BEDTIME 05/07/15  Yes  Reah Justo, PA-C  Blood Glucose Monitoring Suppl (BAYER CONTOUR NEXT MONITOR) W/DEVICE KIT USE AS DIRECTED 12/21/14  Yes Korin Setzler, PA-C  buPROPion (WELLBUTRIN XL) 150 MG 24 hr tablet TAKE 1 TABLET BY MOUTH DAILY 04/16/15  Yes Junice Fei, PA-C  clonazePAM (KLONOPIN) 2 MG tablet TAKE TWO TABLETS BY MOUTH AT BEDTIME AND ONE ONCE DAILY AS NEEDED 04/27/15  Yes Melbert Botelho, PA-C  cyclobenzaprine (FLEXERIL) 10 MG tablet Take 1 tablet (10 mg total) by mouth 3 (three) times daily as needed for muscle spasms. 08/06/14  Yes Nakiea Metzner, PA-C  emtricitabine-tenofovir (TRUVADA) 200-300 MG per tablet Take 1 tablet by mouth daily.   Yes Historical Provider, MD  glucose blood test strip Use as instructed 08/25/14  Yes Harrison Mons, PA-C  Investigational - Study Medication Take by mouth.   Yes Historical Provider, MD  Lancets MISC Use to test blood sugar as directed. 08/25/14  Yes Sedale Jenifer, PA-C  lisinopril (PRINIVIL,ZESTRIL) 2.5 MG tablet TAKE 1 TABLET BY MOUTH DAILY 04/16/15  Yes Yoav Okane, PA-C  lopinavir-ritonavir (KALETRA) 200-50 MG per tablet 2 tablets. Take 2 tablets by mouth 2 times daily. 10/16/11  Yes Historical Provider, MD  metFORMIN (GLUCOPHAGE-XR) 500 MG 24 hr tablet Take 2 tablets (1,000 mg total) by mouth daily with breakfast. 11/16/14  Yes Midge Momon, PA-C  ondansetron (ZOFRAN-ODT) 8 MG disintegrating tablet Take 1 tablet (8 mg total) by mouth every 8 (eight) hours as needed for nausea. 10/01/14  Yes Roselee Culver, MD  pantoprazole (PROTONIX) 40 MG tablet Take by mouth. 08/06/14  Yes Historical Provider, MD  raltegravir (ISENTRESS) 400 MG tablet 400 mg. Take 1 tablet (400 mg total) by mouth 2 times daily. 10/16/11  Yes Historical Provider, MD  sertraline (ZOLOFT) 100 MG tablet TAKE 1& 1/2 TABLETS BY MOUTH DAILY 04/16/15  Yes Nupur Hohman, PA-C  traMADol (ULTRAM) 50 MG tablet TAKE ONE TABLET BY MOUTH EVERY 8 HOURS AS NEEDED 11/30/14  Yes Yolanda Huffstetler, PA-C  traZODone  (DESYREL) 100 MG tablet TAKE 2 TABLETS(200 MG) BY MOUTH AT BEDTIME 04/16/15  Yes Ivar Domangue, PA-C  UNABLE TO FIND Blood glucose meter (which ever meter insurance will pay for) 08/21/14  Yes Mi Balla, PA-C  valACYclovir (VALTREX) 1000 MG tablet 1,000 mg. Take 1 tablet (1,000 mg total) by mouth daily. 10/16/11  Yes Historical Provider, MD  Investigational - Study Medication Take by mouth.    Historical Provider, MD  nabumetone (RELAFEN) 500 MG tablet Take 1-2 tablets (500-1,000 mg total) by mouth 2 (two) times daily as needed. Patient not taking: Reported on 05/19/2015 08/06/14   Harrison Mons, PA-C     No Known Allergies     Objective:  Physical Exam  Constitutional: She is oriented to person, place, and time. She appears well-developed and well-nourished. She is active and cooperative. She has a sickly appearance. No distress.  BP 126/74 mmHg  Pulse 105  Temp(Src) 97.8 F (36.6 C) (Oral)  Resp 18  Ht _0  (1.753 m)  Wt 181 lb (82.101 kg)  BMI 26.72 kg/m2  SpO2 98%  HENT:  Head: Normocephalic and atraumatic.  Right Ear: Hearing, tympanic membrane, external ear and ear canal normal.  Left Ear: Hearing, tympanic membrane, external ear and ear canal normal.  Nose: Mucosal edema and rhinorrhea present. Right sinus exhibits no maxillary sinus tenderness and no frontal sinus tenderness. Left sinus exhibits maxillary sinus tenderness and frontal sinus tenderness.  Mouth/Throat: Uvula is midline, oropharynx is clear and moist and mucous membranes are normal. No oral lesions. No uvula swelling.  Eyes: Conjunctivae are normal. No scleral icterus.  Neck: Normal range of motion, full passive range of motion without pain and phonation normal. Neck supple. No thyromegaly present.  Cardiovascular: Regular rhythm and normal heart sounds.  Tachycardia present.   Pulses:      Radial pulses are 2+ on the right side, and 2+ on the left side.  Pulmonary/Chest: Effort normal and breath sounds  normal. She has no decreased breath sounds. She has no wheezes. She has no rhonchi. She has no rales.  Breath sounds are modestly coarse. Deep breathing aggravates cough, which sounds wet, and is productive.  Lymphadenopathy:       Head (right side): No tonsillar, no preauricular, no posterior auricular and no occipital adenopathy present.       Head (left side): No tonsillar, no preauricular, no posterior auricular and no occipital adenopathy present.    She has no cervical adenopathy.       Right: No supraclavicular adenopathy present.       Left: No supraclavicular adenopathy present.  Neurological: She is alert and oriented to person, place, and time. She has normal strength. No sensory deficit.  Skin: Skin is warm, dry and intact. No rash noted. No cyanosis or erythema. Nails show no clubbing.  Psychiatric: She has a normal mood and affect. Her speech is normal and behavior is normal.   Results for orders placed or performed in visit on 05/19/15  POCT Influenza A/B  Result Value Ref Range   Influenza A, POC Negative  Negative   Influenza B, POC Negative Negative    Dg Chest 2 View  05/19/2015  CLINICAL DATA:  Shortness of breath EXAM: CHEST  2 VIEW COMPARISON:  08/06/2014 FINDINGS: Lungs are clear.  No pleural effusion or pneumothorax. The heart is normal in size. Visualized osseous structures are within normal limits. IMPRESSION: No evidence of acute cardiopulmonary disease. Electronically Signed   By: Julian Hy M.D.   On: 05/19/2015 14:27           Assessment & Plan:   1. Cough 2. Body aches Normal CXR, negative influenza test. Treat for suspected sinusitis. More aggressive treatment as she is immunocompromised. Supportive care. - POCT Influenza A/B - DG Chest 2 View; Future - amoxicillin-clavulanate (AUGMENTIN) 875-125 MG tablet; Take 1 tablet by mouth 2 (two) times daily.  Dispense: 20 tablet; Refill: 0 - chlorpheniramine-HYDROcodone (TUSSIONEX PENNKINETIC ER) 10-8  MG/5ML SUER; Take 5 mLs by mouth every 12 (twelve) hours as needed for cough.  Dispense: 100 mL; Refill: 0 - Guaifenesin (MUCINEX MAXIMUM STRENGTH) 1200 MG TB12; Take 1 tablet (1,200 mg total) by mouth every 12 (twelve) hours as needed.  Dispense: 14 tablet; Refill: 1    Fara Chute, PA-C Physician Assistant-Certified [facility]

## 2015-05-29 ENCOUNTER — Other Ambulatory Visit: Payer: Self-pay | Admitting: Physician Assistant

## 2015-06-04 ENCOUNTER — Other Ambulatory Visit: Payer: Self-pay | Admitting: Physician Assistant

## 2015-06-07 ENCOUNTER — Other Ambulatory Visit: Payer: Self-pay | Admitting: Physician Assistant

## 2015-06-08 DIAGNOSIS — R059 Cough, unspecified: Secondary | ICD-10-CM | POA: Insufficient documentation

## 2015-06-08 DIAGNOSIS — R05 Cough: Secondary | ICD-10-CM | POA: Insufficient documentation

## 2015-06-10 ENCOUNTER — Ambulatory Visit (INDEPENDENT_AMBULATORY_CARE_PROVIDER_SITE_OTHER): Payer: BLUE CROSS/BLUE SHIELD | Admitting: Family Medicine

## 2015-06-10 ENCOUNTER — Other Ambulatory Visit: Payer: Self-pay

## 2015-06-10 VITALS — BP 128/80 | HR 91 | Temp 98.2°F | Resp 16 | Ht 69.0 in | Wt 190.0 lb

## 2015-06-10 DIAGNOSIS — R05 Cough: Secondary | ICD-10-CM | POA: Diagnosis not present

## 2015-06-10 DIAGNOSIS — R059 Cough, unspecified: Secondary | ICD-10-CM

## 2015-06-10 DIAGNOSIS — Z111 Encounter for screening for respiratory tuberculosis: Secondary | ICD-10-CM

## 2015-06-10 MED ORDER — TUBERCULIN PPD 5 UNIT/0.1ML ID SOLN: 5.0000 [IU] | Freq: Once | INTRADERMAL | Status: DC

## 2015-06-10 MED ORDER — ALBUTEROL SULFATE HFA 108 (90 BASE) MCG/ACT IN AERS: 2.0000 | INHALATION_SPRAY | RESPIRATORY_TRACT | Status: DC | PRN

## 2015-06-10 MED ORDER — HYDROCOD POLST-CPM POLST ER 10-8 MG/5ML PO SUER: 5.0000 mL | Freq: Two times a day (BID) | ORAL | Status: DC | PRN

## 2015-06-10 NOTE — Progress Notes (Signed)
  Tuberculosis Risk Questionnaire  1. No Were you born outside the [place] in one of the following parts of the world: [place], [place], [place], [place] or [place]?    2. No Have you traveled outside the [place] and lived for more than one month in one of the following parts of the world: [place], [place], [place], [place] or [place]?    3. Yes  Do you have a compromised immune system such as from any of the following conditions:HIV/AIDS, organ or bone marrow transplantation, diabetes, immunosuppressive medicines (e.g. Prednisone, Remicaide), leukemia, lymphoma, cancer of the head or neck, gastrectomy or jejunal bypass, end-stage renal disease (on dialysis), or silicosis?     4. Yes Have you ever or do you plan on working in: a residential care center, a health care facility, a jail or prison or homeless shelter?    5. No Have you ever: injected illegal drugs, used crack cocaine, lived in a homeless shelter  or been in jail or prison?     6. No Have you ever been exposed to anyone with infectious tuberculosis?    Tuberculosis Symptom Questionnaire  Do you currently have any of the following symptoms?  1. Yes  Unexplained cough lasting more than 3 weeks?   2. No Unexplained fever lasting more than 3 weeks.   3. No Night Sweats (sweating that leaves the bedclothes and sheets wet)     4. No Shortness of Breath   5. No Chest Pain   6. No Unintentional weight loss    7. No Unexplained fatigue (very tired for no reason)

## 2015-06-10 NOTE — Progress Notes (Signed)
   Subjective:    Patient ID: Tracy Andrade, female    DOB: 06/05/1960, 55 y.o.   MRN: [id number]  HPI This is a 55 yo female who presents with cough for 3-4 weeks. She was seen 05/19/15 with URI and was given augmentin and tussionex. She has had resolution of symptoms other than dry cough and some chest tightness. Had some diarrhea on Augmentin which has since resolved. No history of asthma. Non smoker.   She is also requesting form completion for [employer] where she works. Needs TB skin testing. [facility]   Past Medical History  Diagnosis Date  . Arthritis   . HIV infection (HCC)   . Depression   . Anxiety   . Insomnia   . GERD (gastroesophageal reflux disease)   . Eczema   . Herpes simplex type 2 infection   . DM (diabetes mellitus) (HCC)    Past Surgical History  Procedure Laterality Date  . Abdominal hysterectomy    . Colon surgery     Family History  Problem Relation Age of Onset  . Cancer Maternal Grandmother   . Hypertension Maternal Grandfather   . Lung cancer Mother     remission as of 06/2013   Social History  Substance Use Topics  . Smoking status: Former Games developermoker  . Smokeless tobacco: Never Used  . Alcohol Use: No      Review of Systems  Constitutional: Negative for fever, chills and fatigue.  Respiratory: Positive for cough (nonproductive) and chest tightness. Negative for wheezing.   Cardiovascular: Negative for chest pain and leg swelling.       Objective:   Physical Exam  Constitutional: She is oriented to person, place, and time. She appears well-developed and well-nourished.  HENT:  Head: Normocephalic and atraumatic.  Right Ear: Tympanic membrane, external ear and ear canal normal.  Left Ear: Tympanic membrane, external ear and ear canal normal.  Nose: Nose normal.  Mouth/Throat: Oropharynx is clear and moist. No oropharyngeal exudate.  Eyes: Conjunctivae are normal.  Neck: Normal range of motion. Neck  supple.  Cardiovascular: Normal rate, regular rhythm and normal heart sounds.   Pulmonary/Chest: Effort normal and breath sounds normal.  Musculoskeletal: Normal range of motion.  Neurological: She is alert and oriented to person, place, and time.  Skin: Skin is warm and dry.  Psychiatric: She has a normal mood and affect. Her behavior is normal. Judgment and thought content normal.  Vitals reviewed.     BP 128/80 mmHg  Pulse 91  Temp(Src) 98.2 F (36.8 C)  Resp 16  Ht 5\' 9"  (1.753 m)  Wt 190 lb (86.183 kg)  BMI 28.05 kg/m2  SpO2 98%     Assessment & Plan:  1. Screening for tuberculosis - tuberculin injection 5 Units; Inject 0.1 mLs (5 Units total) into the skin once.  2. Cough - albuterol (PROVENTIL HFA;VENTOLIN HFA) 108 (90 Base) MCG/ACT inhaler; Inhale 2 puffs into the lungs every 4 (four) hours as needed for wheezing or shortness of breath (cough, shortness of breath or wheezing.).  Dispense: 1 Inhaler; Refill: 1 - chlorpheniramine-HYDROcodone (TUSSIONEX PENNKINETIC ER) 10-8 MG/5ML SUER; Take 5 mLs by mouth every 12 (twelve) hours as needed for cough.  Dispense: 100 mL; Refill: 0 - RTC if worsening symptoms or if no improvement in 5-7 days.   Olean Reeeborah Tramell Piechota, FNP-BC  [facility]  06/10/2015 11:51 AM

## 2015-06-10 NOTE — Patient Instructions (Signed)

## 2015-06-10 NOTE — Telephone Encounter (Signed)
Chelle, I received faxes from pharm ([facility] [address] [place]) requesting RFs of bupropion, sertraline and lisinopril. I see that she has "arrived" to see you, but wanted to let you know that she will need new Rxs for these if still appropriate after eval. Pt has many choices of pharm on profile in EPIC.

## 2015-06-12 DIAGNOSIS — Z111 Encounter for screening for respiratory tuberculosis: Secondary | ICD-10-CM

## 2015-06-12 LAB — TB SKIN TEST
INDURATION: 0 mm
TB SKIN TEST: NEGATIVE

## 2015-06-12 MED ORDER — SERTRALINE HCL 100 MG PO TABS: ORAL_TABLET | ORAL | Status: DC

## 2015-06-12 MED ORDER — BUPROPION HCL ER (XL) 150 MG PO TB24: 150.0000 mg | ORAL_TABLET | Freq: Every day | ORAL | Status: DC

## 2015-06-12 MED ORDER — LISINOPRIL 2.5 MG PO TABS: 2.5000 mg | ORAL_TABLET | Freq: Every day | ORAL | Status: DC

## 2015-06-12 NOTE — Telephone Encounter (Signed)
Pt has been seen but her DM was not addressed.  I gave her a 2 week supply in order for her to RTC to have her labs.

## 2015-06-13 NOTE — Telephone Encounter (Signed)
Spoke with patient she has refills for her DM medication

## 2015-06-16 ENCOUNTER — Telehealth: Payer: Self-pay

## 2015-06-16 ENCOUNTER — Other Ambulatory Visit: Payer: Self-pay | Admitting: Physician Assistant

## 2015-06-16 NOTE — Telephone Encounter (Signed)
[facility] called looking for RFs for pt who states she is out of med. See notes from Sarah under 3/23 RF req. Pt needs to be seen for her chronic meds/Dxs and labs done. Collier FlowersSarah OKd 2 wk RFs which [facility], but will re-send here where pt is requesting RFs.

## 2015-06-16 NOTE — Telephone Encounter (Signed)
Pt is furious and would like to speak to ONLY chelle about her medication Zoloft. Someone keeps denying her medication and saying she needs a office visit when she has been in the past 3 days. The meds were also sent to the wrong place. She states if she doesn't get a phone call back today, we will be seeing her in person tomorrow.  Please advise pt  [phone]

## 2015-06-29 ENCOUNTER — Other Ambulatory Visit: Payer: Self-pay | Admitting: Physician Assistant

## 2015-06-30 NOTE — Telephone Encounter (Signed)
Left message for pt to call back  °

## 2015-06-30 NOTE — Telephone Encounter (Signed)
Pt would like a CB concerning this call. Please advise at [phone]

## 2015-06-30 NOTE — Telephone Encounter (Signed)
The person checking my in basket while I was out of the office 3/13-3/28 does not know the patient well, like I do, so the re-evaluation was recommended.  She is overdue for follow-up labs, etc on her glucose, but we were deferring that due to the fact that she didn't have health insurance.  Also, the message she left on 3/29 was never routed, so I was not aware of this issue until today.  Please clarify which pharmacy the sertraline needs to go to and I will send it.

## 2015-06-30 NOTE — Telephone Encounter (Signed)
Chelle, see ph message dated 3/29 that was just routed to you earlier today.

## 2015-07-02 ENCOUNTER — Other Ambulatory Visit: Payer: Self-pay | Admitting: Physician Assistant

## 2015-07-02 NOTE — Telephone Encounter (Signed)
Pt had not responded to Tamara's message asking for CB to clarify pharm for you. Since [facility] [address] [place] [facility], I feel sure this is the pharm pt is using. I tried to call pt again, but got VM. I left a message explaining your message on 3/29 encounter and advised her that Chelle has sent these three RFs in there. Asked for CB w/any further ?s/concerns.

## 2015-07-02 NOTE — Telephone Encounter (Signed)
Meds ordered this encounter  Medications  . buPROPion (WELLBUTRIN XL) 150 MG 24 hr tablet    Sig: TAKE 1 TABLET BY MOUTH DAILY    Dispense:  90 tablet    Refill:  1  . sertraline (ZOLOFT) 100 MG tablet    Sig: Take 1.5 tablets (150 mg total) by mouth daily.    Dispense:  135 tablet    Refill:  1  . lisinopril (PRINIVIL,ZESTRIL) 2.5 MG tablet    Sig: TAKE 1 TABLET BY MOUTH DAILY    Dispense:  90 tablet    Refill:  1

## 2015-07-02 NOTE — Telephone Encounter (Signed)
See notes under 06/29/15 refill enc.

## 2015-07-03 ENCOUNTER — Other Ambulatory Visit: Payer: Self-pay | Admitting: Physician Assistant

## 2015-07-05 NOTE — Telephone Encounter (Signed)
Chelle just OKd 90 day supplies w/1 RF on pt's other chronic meds that have been req'd. I will OK same of trazodone.

## 2015-07-08 NOTE — Telephone Encounter (Signed)
Meds ordered this encounter  Medications  . clonazePAM (KLONOPIN) 2 MG tablet    Sig: TAKE ONE TABLET BY MOUTH ONCE DAILY AS NEEDED AND TWO ONCE DAILY AT BEDTIME    Dispense:  90 tablet    Refill:  0

## 2015-07-09 NOTE — Telephone Encounter (Signed)
Faxed

## 2015-07-17 ENCOUNTER — Other Ambulatory Visit: Payer: Self-pay | Admitting: Physician Assistant

## 2015-07-18 NOTE — Telephone Encounter (Signed)
Do you want to refill Flexeril?

## 2015-07-20 NOTE — Telephone Encounter (Signed)
Meds ordered this encounter  Medications  . cyclobenzaprine (FLEXERIL) 10 MG tablet    Sig: TAKE 1 TABLET(10 MG) BY MOUTH THREE TIMES DAILY AS NEEDED FOR MUSCLE SPASMS    Dispense:  30 tablet    Refill:  0

## 2015-08-12 ENCOUNTER — Other Ambulatory Visit: Payer: Self-pay | Admitting: Physician Assistant

## 2015-08-17 NOTE — Telephone Encounter (Signed)
Faxed

## 2015-08-19 ENCOUNTER — Other Ambulatory Visit: Payer: Self-pay | Admitting: Physician Assistant

## 2015-08-21 ENCOUNTER — Other Ambulatory Visit: Payer: Self-pay | Admitting: Physician Assistant

## 2015-08-21 NOTE — Telephone Encounter (Signed)
Do you want patient to return to clinic?

## 2015-08-21 NOTE — Telephone Encounter (Signed)
Meds ordered this encounter  Medications  . cyclobenzaprine (FLEXERIL) 10 MG tablet    Sig: TAKE 1 TABLET(10 MG) BY MOUTH THREE TIMES DAILY AS NEEDED FOR MUSCLE SPASMS    Dispense:  30 tablet    Refill:  0   Yes, she needs a visit and labs!

## 2015-08-23 ENCOUNTER — Ambulatory Visit (INDEPENDENT_AMBULATORY_CARE_PROVIDER_SITE_OTHER): Payer: BLUE CROSS/BLUE SHIELD

## 2015-08-23 ENCOUNTER — Ambulatory Visit (INDEPENDENT_AMBULATORY_CARE_PROVIDER_SITE_OTHER): Payer: BLUE CROSS/BLUE SHIELD | Admitting: Physician Assistant

## 2015-08-23 VITALS — BP 118/76 | HR 90 | Temp 97.9°F | Resp 18 | Ht 69.0 in | Wt 186.2 lb

## 2015-08-23 DIAGNOSIS — M549 Dorsalgia, unspecified: Secondary | ICD-10-CM

## 2015-08-23 DIAGNOSIS — N644 Mastodynia: Secondary | ICD-10-CM | POA: Diagnosis not present

## 2015-08-23 DIAGNOSIS — Z23 Encounter for immunization: Secondary | ICD-10-CM

## 2015-08-23 DIAGNOSIS — G8929 Other chronic pain: Secondary | ICD-10-CM

## 2015-08-23 DIAGNOSIS — K219 Gastro-esophageal reflux disease without esophagitis: Secondary | ICD-10-CM

## 2015-08-23 DIAGNOSIS — R059 Cough, unspecified: Secondary | ICD-10-CM

## 2015-08-23 DIAGNOSIS — E119 Type 2 diabetes mellitus without complications: Secondary | ICD-10-CM | POA: Diagnosis not present

## 2015-08-23 DIAGNOSIS — R05 Cough: Secondary | ICD-10-CM

## 2015-08-23 DIAGNOSIS — R159 Full incontinence of feces: Secondary | ICD-10-CM | POA: Diagnosis not present

## 2015-08-23 LAB — COMPREHENSIVE METABOLIC PANEL
ALBUMIN: 3.8 g/dL (ref 3.6–5.1)
ALT: 30 U/L — ABNORMAL HIGH (ref 6–29)
AST: 21 U/L (ref 10–35)
Alkaline Phosphatase: 64 U/L (ref 33–130)
BUN: 12 mg/dL (ref 7–25)
CALCIUM: 9.1 mg/dL (ref 8.6–10.4)
CHLORIDE: 105 mmol/L (ref 98–110)
CO2: 27 mmol/L (ref 20–31)
CREATININE: 0.84 mg/dL (ref 0.50–1.05)
Glucose, Bld: 83 mg/dL (ref 65–99)
Potassium: 4.6 mmol/L (ref 3.5–5.3)
SODIUM: 140 mmol/L (ref 135–146)
TOTAL PROTEIN: 6.8 g/dL (ref 6.1–8.1)
Total Bilirubin: 0.3 mg/dL (ref 0.2–1.2)

## 2015-08-23 LAB — LIPID PANEL
CHOLESTEROL: 138 mg/dL (ref 125–200)
HDL: 55 mg/dL (ref >=46)
LDL Cholesterol: 68 mg/dL (ref <130)
TRIGLYCERIDES: 74 mg/dL (ref <150)
Total CHOL/HDL Ratio: 2.5 Ratio (ref <=5.0)
VLDL: 15 mg/dL (ref <30)

## 2015-08-23 LAB — POCT GLYCOSYLATED HEMOGLOBIN (HGB A1C): Hemoglobin A1C: 7.1

## 2015-08-23 LAB — GLUCOSE, POCT (MANUAL RESULT ENTRY): POC Glucose: 95 mg/dl (ref 70–99)

## 2015-08-23 MED ORDER — CANAGLIFLOZIN 100 MG PO TABS: 100.0000 mg | ORAL_TABLET | Freq: Every day | ORAL | Status: DC

## 2015-08-23 MED ORDER — CYCLOBENZAPRINE HCL 10 MG PO TABS: ORAL_TABLET | ORAL | Status: DC

## 2015-08-23 MED ORDER — NABUMETONE 750 MG PO TABS: 750.0000 mg | ORAL_TABLET | Freq: Two times a day (BID) | ORAL | Status: DC

## 2015-08-23 NOTE — Patient Instructions (Addendum)
     IF you received an x-ray today, you will receive an invoice from [facility]. Please contact [facility] at [phone] with questions or concerns regarding your invoice.   IF you received labwork today, you will receive an invoice from [facility]. Please contact [facility] at [phone] with questions or concerns regarding your invoice.   Our billing staff will not be able to assist you with questions regarding bills from these companies.  You will be contacted with the lab results as soon as they are available. The fastest way to get your results is to activate your My Chart account. Instructions are located on the last page of this paperwork. If you have not heard from us regarding the results in 2 weeks, please contact this office.    1. Stop the lisinopril.  -If the cough resolves in about 2 weeks, restart the lisinopril. If the cough recurs, stop the lisinopril again and contact me. At that point, we will know that the lisinopril was the cause and will consider using a different medication for protecting your kidneys from the diabetes. -If the cough does NOT resolve in 2 weeks, restart the lisinopril and contact me. We will then treat the cough as possible reflux.  2. Stop the metformin. Instead, start the Invokana. It's very important that you drink lots of water when you take this medication, especially the first 2 weeks, as it can cause dehydration. If the diarrhea/stool incontinence doesn't improve, talk to your HIV doctor, as it could be a side effect of one of your new medications.

## 2015-08-23 NOTE — Progress Notes (Signed)
Subjective:    Patient ID: Tracy Andrade, female    DOB: [birth year], 55 y.o.   MRN: [id number] Chief Complaint  Patient presents with  . Follow-up  . Cough    patient states that she is still coughing   HPI  Patient presents today for follow up of cough x2 month which occurs mostly at night and associated with minimal musculoskeletal chest pain, and occasional white phlem.  Denies sore throat, congestion, fever, nausea, and vomiting.  Initally seen on  05/19/15 for URI, and treated with Augmentin, Tussionex and Mucinex with relief of all symptoms except her cough.  Returned on 06/10/2015, had negative Tb screening and given albuterol inhaler.  No improvement noted.  -Complains of worsening back spasms x 2 months.  Patient states she has had back spasms for over a year but they have become worse since starting a new job where she is on her feet longer.  Start in the middle of her back and radiate to her hips, an occur multiple times a day.  "Sometime spasm hits me in chest then rolls to back" Denies heavy lifting, or improvement with sitting or standing.  Patient states no improvement using 1 cyclobenzaprine, so she has been taking 2 pills instead, prescribed by this office last year after initial  evaluation for chronic back pain 08/06/14.  -Complains of occasional shooting abdominal pain and persistent diarrhea x 2 months.  States that is started when she began Metformin.  She has experienced an episode of fecal incontinence while at work last week and has to wear depends almost daily for fear of another "accident".  For the last week she has noted improvement with mostly formed stools.  -Complains of breast pain with with palpation, requesting referral for mammogram.  Patient managed by [facility] for [facility], medication regime recently changed 2 months ago.  Review of Systems  All others negative except those listed in HPI.  Patient Active Problem List   Diagnosis Date Noted  . Colon cancer screening  10/10/2014  . Nausea with vomiting 10/10/2014  . Diarrhea 10/10/2014  . Herpes simplex type 2 infection 08/06/2014  . Type 2 diabetes mellitus without complication ([place]) 78/29/5621  . Obesity (BMI 30-39.9) 07/12/2013  . Post-menopausal 01/24/2013  . Hyperglycemia 01/24/2013  . Eczema 01/24/2013  . Stress incontinence 01/24/2013  . HIV disease ([place]) 05/10/2012  . Insomnia 05/10/2012  . Anxiety and depression 05/10/2012  . Chronic back pain 05/10/2012  . Condyloma acuminata 05/10/2012  . 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 GERD (gastroesophageal reflux disease) 02/02/2011    Current Outpatient Prescriptions on File Prior to Visit  Medication Sig Dispense Refill  . amitriptyline (ELAVIL) 100 MG tablet TAKE 1 TABLET BY MOUTH AT BEDTIME 30 tablet 0  . Blood Glucose Monitoring Suppl (BAYER CONTOUR NEXT MONITOR) W/DEVICE KIT USE AS DIRECTED 1 kit 0  . buPROPion (WELLBUTRIN XL) 150 MG 24 hr tablet TAKE 1 TABLET BY MOUTH DAILY 90 tablet 1  . chlorpheniramine-HYDROcodone (TUSSIONEX PENNKINETIC ER) 10-8 MG/5ML SUER Take 5 mLs by mouth every 12 (twelve) hours as needed for cough. 100 mL 0  . clonazePAM (KLONOPIN) 2 MG tablet TAKE ONE TABLET BY MOUTH ONCE DAILY AS NEEDED AND TWO ONCE DAILY AT BEDTIME 90 tablet 0  . cyclobenzaprine (FLEXERIL) 10 MG tablet TAKE 1 TABLET(10 MG) BY MOUTH THREE TIMES DAILY AS NEEDED FOR MUSCLE SPASMS 30 tablet 0  . glucose blood test strip Use as instructed 100 each 12  . lisinopril (PRINIVIL,ZESTRIL) 2.5 MG  tablet Take 1 tablet (2.5 mg total) by mouth daily. 15 tablet 0  . lisinopril (PRINIVIL,ZESTRIL) 2.5 MG tablet TAKE 1 TABLET BY MOUTH DAILY 90 tablet 1  . lopinavir-ritonavir (KALETRA) 200-50 MG per tablet 2 tablets. Take 2 tablets by mouth 2 times daily.    . metFORMIN (GLUCOPHAGE-XR) 500 MG 24 hr tablet Take 2 tablets (1,000 mg total) by mouth daily with breakfast. 180 tablet 3  . nabumetone (RELAFEN) 500 MG tablet Take 1-2 tablets (500-1,000 mg total) by mouth 2  (two) times daily as needed. 120 tablet 5  . raltegravir (ISENTRESS) 400 MG tablet 400 mg. Take 1 tablet (400 mg total) by mouth 2 times daily.    . traMADol (ULTRAM) 50 MG tablet TAKE ONE TABLET BY MOUTH EVERY 8 HOURS AS NEEDED 30 tablet 0  . traZODone (DESYREL) 100 MG tablet TAKE 2 TABLETS BY MOUTH AT BEDTIME 180 tablet 1  . buPROPion (WELLBUTRIN XL) 150 MG 24 hr tablet Take 1 tablet (150 mg total) by mouth daily. (Patient not taking: Reported on 08/23/2015) 15 tablet 0  . emtricitabine-tenofovir (TRUVADA) 200-300 MG per tablet Take 1 tablet by mouth daily. Reported on 08/23/2015    . Lancets MISC Use to test blood sugar as directed. (Patient not taking: Reported on 08/23/2015) 100 each 12  . sertraline (ZOLOFT) 100 MG tablet TAKE 1& 1/2 TABLETS BY MOUTH DAILY (Patient not taking: Reported on 08/23/2015) 23 tablet 0  . sertraline (ZOLOFT) 100 MG tablet Take 1.5 tablets (150 mg total) by mouth daily. (Patient not taking: Reported on 08/23/2015) 135 tablet 1   No current facility-administered medications on file prior to visit.   No Known Allergies     Objective: BP 118/76 mmHg  Pulse 90  Temp(Src) 97.9 F (36.6 C) (Oral)  Resp 18  Ht 5' 9"  (1.753 m)  Wt 186 lb 3.2 oz (84.46 kg)  BMI 27.48 kg/m2  SpO2 96%   Physical Exam  Constitutional: She is oriented to person, place, and time. She appears well-developed and well-nourished.  Neck: Normal range of motion. Neck supple. No thyromegaly present.  Cardiovascular: Normal rate, regular rhythm, normal heart sounds and intact distal pulses.   Pulmonary/Chest: Effort normal and breath sounds normal.  Musculoskeletal: She exhibits tenderness.  Tenderness to palpation at SI joints.  Neurological: She is alert and oriented to person, place, and time.   Dg Lumbar Spine Complete  08/23/2015  CLINICAL DATA:  Low back pain. EXAM: LUMBAR SPINE - COMPLETE 4+ VIEW COMPARISON:  Aug 06, 2014 FINDINGS: There is no evidence of lumbar spine fracture. Alignment is  normal. Intervertebral disc spaces are maintained. IMPRESSION: Negative. Electronically Signed   By: Dorise Bullion III M.D   On: 08/23/2015 14:49         Assessment & Plan:  1. Cough -Etiology Unclear.   -Instructed patient to stop lisinopril.   -If cough resolves within 2 weeks, restart and contact office if it returns.  If it is an ACEI cough, will consider alternate management for kidney protection from diabetes such as an ARB.   -If cough does NOT resolve in 2 weeks restart lisinopril and contact office and we will treat cough as possible reflux.  2. Chronic back pain -Imaging of spine shows no findings or changes from the last year.   -Patient referred to PT for core strengthening and stretches to help improve ROM and stability.   -Muscle relaxer refilled, patient instructed to only take 1 tablet for relief of spasm up to  3 times a day as needed.  -NSAID refilled for pain relief  - DG Lumbar Spine Complete; Future - Ambulatory referral to Physical Therapy - cyclobenzaprine (FLEXERIL) 10 MG tablet; TAKE 1 TABLET(10 MG) BY MOUTH THREE TIMES DAILY AS NEEDED FOR MUSCLE SPASMS  Dispense: 90 tablet; Refill: 3 - nabumetone (RELAFEN) 750 MG tablet; Take 1-2 tablets (750-1,500 mg total) by mouth 2 (two) times daily.  Dispense: 120 tablet; Refill: 5  3. Stool incontinence -Patient instructed to stop Metformin.  Added Invokana to daily regimen.   -If diarrhea persists, instructed to contact HIV provider because it may be related to her new regimen.  4. Mastalgia Diagnostic mammogram ordered for patient to determine if pain etiology due to a mass. - MM Digital Diagnostic Bilat; Future  5. Type 2 diabetes mellitus without complication, without long-term current use of insulin (HCC) Stable, A1C -7.1, glucose 95, other labs pending.   -Metformin stopped due to side effects, added Invokana. -Baseline labs prior to new medication use.   -Patient instructed to continue healthy diet and  exercise to achieve optimal control of diabetes. -Continue daily glucose monitoring. - POCT glucose (manual entry) - POCT glycosylated hemoglobin (Hb A1C) - Comprehensive metabolic panel - Lipid panel - canagliflozin (INVOKANA) 100 MG TABS tablet; Take 1 tablet (100 mg total) by mouth daily before breakfast.  Dispense: 30 tablet; Refill: 5  6. Gastroesophageal reflux disease, esophagitis presence not specified -Chronic, stable.   -Potential etiology to be explored if cough is not ACEI related. See above.  7. Need for pneumococcal vaccination -Vaccination given - Pneumococcal polysaccharide vaccine 23-valent greater than or equal to 2yo subcutaneous/IM  Lamar Meter P. Neveen Daponte, PA-S

## 2015-08-23 NOTE — Progress Notes (Signed)
Patient ID: Tracy Andrade, female    DOB: 1961-03-02, 55 y.o.   MRN: [id number]  PCP: Wynne Dust  Subjective:   Chief Complaint  Patient presents with  . Follow-up  . Cough    patient states that she is still coughing    HPI Presents for evaluation of several issues.  1. Cough x 2 months. Occurs primarily at night, associated with minimal musculoskeletal chest pain, and occasional white phlem. Denies sore throat, congestion, fever, nausea, and vomiting. Initally seen for this cough  05/19/15. CXR at that time was normal, rapid flu was negative. Treated with Augmentin, Tussionex and Mucinex for illness, very likely viral but more aggressive treatment due to HIV disease. She experienced relief of all symptoms except her cough. Returned here  on 06/10/2015 with persistent cough. She needed a TB skin test for work, which was negative. Tussionex was refilled and albuterol inhaler added. No improvement noted.  2. Back pain/spasms, worsening x 2 months.This is a long-standing issue, but worsened since starting a new job where she is on her feet longer. the pain starts in the middle of her back and radiates to both hips. Symptoms are intermittent, but occur multiple times every day. "Sometime spasm hits me in chest then rolls to my back." Denies heavy lifting, or improvement with sitting or standing. She has increased the cyclobenzaprine and has been taking 2 of the 10 mg tablets since one wasn't effective. No loss of bladder control. She's had some stool incontinence, but thinks that is related to the metformin she takes for diabetes. No sadlle anesthesia. No radicular pain or paresthesias. No leg weakness.  3. Occasional shooting abdominal pain and diarrhea x 2 months. Symptoms began when she started Metformin.It is of note that her HIV regimen was also changed 2 months ago. 1 episode of fecal incontinence while at work last week. Now wears Depends almost daily, as she is fearful of  another "accident". More formed stools this past week.  4. Breast pain, tenderness. Desires referral for mammogram.    Review of Systems As above.    Patient Active Problem List   Diagnosis Date Noted  . Cough 06/08/2015  . Colon cancer screening 10/10/2014  . Nausea with vomiting 10/10/2014  . Diarrhea 10/10/2014  . Herpes simplex type 2 infection 08/06/2014  . Type 2 diabetes mellitus without complication ([place]) 81/85/6314  . Obesity (BMI 30-39.9) 07/12/2013  . Post-menopausal 01/24/2013  . Hyperglycemia 01/24/2013  . Eczema 01/24/2013  . Stress incontinence 01/24/2013  . HIV disease ([place]) 05/10/2012  . Insomnia 05/10/2012  . Anxiety and depression 05/10/2012  . Chronic back pain 05/10/2012  . Condyloma acuminata 05/10/2012  . 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 GERD (gastroesophageal reflux disease) 02/02/2011     Prior to Admission medications   Medication Sig Start Date End Date Taking? Authorizing Provider  amitriptyline (ELAVIL) 100 MG tablet TAKE 1 TABLET BY MOUTH AT BEDTIME 08/19/15  Yes Jazari Ober, PA-C  Blood Glucose Monitoring Suppl (BAYER CONTOUR NEXT MONITOR) W/DEVICE KIT USE AS DIRECTED 12/21/14  Yes Brionne Mertz, PA-C  buPROPion (WELLBUTRIN XL) 150 MG 24 hr tablet TAKE 1 TABLET BY MOUTH DAILY 07/02/15  Yes Valleri Hendricksen, PA-C  chlorpheniramine-HYDROcodone (TUSSIONEX PENNKINETIC ER) 10-8 MG/5ML SUER Take 5 mLs by mouth every 12 (twelve) hours as needed for cough. 06/10/15  Yes Elby Beck, FNP  clonazePAM (KLONOPIN) 2 MG tablet TAKE ONE TABLET BY MOUTH ONCE DAILY AS NEEDED AND TWO ONCE DAILY AT BEDTIME 08/17/15  Yes Dari Carpenito, PA-C  cyclobenzaprine (FLEXERIL) 10 MG tablet TAKE 1 TABLET(10 MG) BY MOUTH THREE TIMES DAILY AS NEEDED FOR MUSCLE SPASMS 08/21/15  Yes Rhea Thrun, PA-C  GENVOYA 150-150-200-10 MG TABS tablet  07/29/15  Yes Historical Provider, MD  glucose blood test strip Use as instructed 08/25/14  Yes Dencil Cayson, PA-C  lisinopril  (PRINIVIL,ZESTRIL) 2.5 MG tablet Take 1 tablet (2.5 mg total) by mouth daily. 06/12/15  Yes Sarah Alleen Borne, PA-C  lisinopril (PRINIVIL,ZESTRIL) 2.5 MG tablet TAKE 1 TABLET BY MOUTH DAILY 07/02/15  Yes Shawnie Nicole, PA-C  metFORMIN (GLUCOPHAGE-XR) 500 MG 24 hr tablet Take 2 tablets (1,000 mg total) by mouth daily with breakfast. 11/16/14  Yes Rifka Ramey, PA-C  nabumetone (RELAFEN) 500 MG tablet Take 1-2 tablets (500-1,000 mg total) by mouth 2 (two) times daily as needed. 08/06/14  Yes Gordana Kewley, PA-C  PREZISTA 800 MG tablet TK 1 T PO  D WF WITH GENVOYA 07/29/15  Yes Historical Provider, MD  traMADol (ULTRAM) 50 MG tablet TAKE ONE TABLET BY MOUTH EVERY 8 HOURS AS NEEDED 11/30/14  Yes Carneshia Raker, PA-C  traZODone (DESYREL) 100 MG tablet TAKE 2 TABLETS BY MOUTH AT BEDTIME 07/05/15  Yes Willa Brocks, PA-C  Lancets MISC Use to test blood sugar as directed. Patient not taking: Reported on 08/23/2015 08/25/14   Harrison Mons, PA-C  sertraline (ZOLOFT) 100 MG tablet TAKE 1& 1/2 TABLETS BY MOUTH DAILY Patient not taking: Reported on 08/23/2015 06/12/15   Mancel Bale, PA-C     No Known Allergies     Objective:  Physical Exam  Constitutional: She is oriented to person, place, and time. She appears well-developed and well-nourished. She is active and cooperative. No distress.  BP 118/76 mmHg  Pulse 90  Temp(Src) 97.9 F (36.6 C) (Oral)  Resp 18  Ht 5' 9"  (1.753 m)  Wt 186 lb 3.2 oz (84.46 kg)  BMI 27.48 kg/m2  SpO2 96%  HENT:  Head: Normocephalic and atraumatic.  Right Ear: Hearing normal.  Left Ear: Hearing normal.  Eyes: Conjunctivae are normal. No scleral icterus.  Neck: Normal range of motion. Neck supple. No thyromegaly present.  Cardiovascular: Normal rate, regular rhythm and normal heart sounds.   Pulses:      Radial pulses are 2+ on the right side, and 2+ on the left side.  Pulmonary/Chest: Effort normal and breath sounds normal.  Abdominal: Soft. Bowel sounds are normal. She  exhibits no distension and no mass. There is no tenderness. There is no rebound and no guarding.  Musculoskeletal:       Cervical back: Normal.       Thoracic back: Normal.       Lumbar back: She exhibits pain. She exhibits normal range of motion, no bony tenderness, no swelling, no edema, no deformity, no laceration, no spasm and normal pulse. Tenderness: SI joints, bilaterally.  Lymphadenopathy:       Head (right side): No tonsillar, no preauricular, no posterior auricular and no occipital adenopathy present.       Head (left side): No tonsillar, no preauricular, no posterior auricular and no occipital adenopathy present.    She has no cervical adenopathy.       Right: No supraclavicular adenopathy present.       Left: No supraclavicular adenopathy present.  Neurological: She is alert and oriented to person, place, and time. No sensory deficit.  Skin: Skin is warm, dry and intact. No rash noted. No cyanosis or erythema. Nails show no clubbing.  Psychiatric: She has a normal mood and affect. Her speech is normal and behavior is normal.       Dg Lumbar Spine Complete  08/23/2015  CLINICAL DATA:  Low back pain. EXAM: LUMBAR SPINE - COMPLETE 4+ VIEW COMPARISON:  Aug 06, 2014 FINDINGS: There is no evidence of lumbar spine fracture. Alignment is normal. Intervertebral disc spaces are maintained. IMPRESSION: Negative. Electronically Signed   By: Dorise Bullion III M.D   On: 08/23/2015 14:49   Results for orders placed or performed in visit on 08/23/15  POCT glucose (manual entry)  Result Value Ref Range   POC Glucose 95 70 - 99 mg/dl  POCT glycosylated hemoglobin (Hb A1C)  Result Value Ref Range   Hemoglobin A1C 7.1        Assessment & Plan:   1. Cough Possibly due to ACEI. No improvement with antibiotics, albuterol inhaler, cough suppressant. Stop ACEI. Let me know what results she experiences in 2 weeks.  2. Chronic back pain Normal radiographs. Advised she cannot take so much  cyclobenzaprine. Reduce back to 10 mg TID max. Refer to PT for body mechanics, core strengthening. - DG Lumbar Spine Complete; Future - Ambulatory referral to Physical Therapy - cyclobenzaprine (FLEXERIL) 10 MG tablet; TAKE 1 TABLET(10 MG) BY MOUTH THREE TIMES DAILY AS NEEDED FOR MUSCLE SPASMS  Dispense: 90 tablet; Refill: 3 - nabumetone (RELAFEN) 750 MG tablet; Take 1-2 tablets (750-1,500 mg total) by mouth 2 (two) times daily.  Dispense: 120 tablet; Refill: 5  3. Stool incontinence This is certainly concerning. Possibly due to metformin, but her HIV meds can also cause diarrhea. Stop metformin.  4. Mastalgia - MM Digital Diagnostic Bilat; Future  5. Type 2 diabetes mellitus without complication, without long-term current use of insulin (HCC) Stop ACEI and metformin due to possible adverse effects as above. Start Invokana. Consider ARB if cough resolves with D/C ACEI/ - POCT glucose (manual entry) - POCT glycosylated hemoglobin (Hb A1C) - Comprehensive metabolic panel - Lipid panel - canagliflozin (INVOKANA) 100 MG TABS tablet; Take 1 tablet (100 mg total) by mouth daily before breakfast.  Dispense: 30 tablet; Refill: 5  6. Gastroesophageal reflux disease, esophagitis presence not specified Stable, though considering LPR as cause of cough. May need PPI therapy.  7. Need for pneumococcal vaccination - Pneumococcal polysaccharide vaccine 23-valent greater than or equal to 2yo subcutaneous/IM    Return in about 4 months (around 12/23/2015), or if symptoms worsen or fail to improve, for re-evaluation of diabetes and everything.    Fara Chute, PA-C Physician Assistant-Certified [facility]

## 2015-08-23 NOTE — Telephone Encounter (Signed)
Notified pt of RF and need for f/up. She stated that she is in the office right now for f/up.

## 2015-08-24 ENCOUNTER — Encounter: Payer: Self-pay | Admitting: Physician Assistant

## 2015-08-27 ENCOUNTER — Telehealth: Payer: Self-pay

## 2015-08-27 NOTE — Telephone Encounter (Signed)
1. Do we have coupons for Invokana?  2. Loose stools are ok, but any stool incontinence is NOT ok, I'd want to change the metformin.  3. Glipizide 5 mg 1 PO BID, #60, RF x 5. Once she has insurance again, I want to go back to the Invokana, as the glipizide is not a good long term choice if we can get better alternative.

## 2015-08-27 NOTE — Telephone Encounter (Signed)
Left message for pt to return call regarding medication change.

## 2015-08-27 NOTE — Telephone Encounter (Signed)
Patient has lost her insurance coverage.  She may have insurance next month.   canagliflozin (INVOKANA) 100 MG TABS tablet it too expensive   Option to prescribe zlipizide 5 and 10 mg available.   Do you want her to continue on the medformin - she is experiencing side effects.  Diahrrea  [phone]  [facility] at [facility] [place]

## 2015-09-02 ENCOUNTER — Telehealth: Payer: Self-pay | Admitting: Emergency Medicine

## 2015-09-02 MED ORDER — GLIPIZIDE 5 MG PO TABS
5.0000 mg | ORAL_TABLET | Freq: Two times a day (BID) | ORAL | Status: DC
Start: 2015-09-02 — End: 2016-08-01

## 2015-09-02 NOTE — Telephone Encounter (Signed)
Attempted to reach pt again regarding medication side effects diarrhea with taking Metformin Left VM for pt to stop taking Metformin and take new script Glipizide until she gets insurance coverage Glipizide ordered and [facility] Natasha @ [facility] in [place] made aware

## 2015-09-17 ENCOUNTER — Other Ambulatory Visit: Payer: Self-pay | Admitting: Physician Assistant

## 2015-09-18 NOTE — Telephone Encounter (Signed)
Chelle, elavil was on med list at June check up, but don't see it discussed. Do you want to RF?

## 2015-09-20 NOTE — Telephone Encounter (Signed)
Meds ordered this encounter  Medications  . amitriptyline (ELAVIL) 100 MG tablet    Sig: TAKE 1 TABLET BY MOUTH AT BEDTIME    Dispense:  90 tablet    Refill:  1

## 2015-09-22 ENCOUNTER — Other Ambulatory Visit: Payer: Self-pay | Admitting: Physician Assistant

## 2015-09-22 NOTE — Telephone Encounter (Signed)
Meds ordered this encounter  Medications  . clonazePAM (KLONOPIN) 2 MG tablet    Sig: TAKE ONE TABLET BY MOUTH ONCE DAILY AS NEEDED AND TWO AT BEDTIME    Dispense:  90 tablet    Refill:  0

## 2015-09-23 NOTE — Telephone Encounter (Signed)
Rx Klonopin faxed

## 2015-09-27 ENCOUNTER — Other Ambulatory Visit: Payer: Self-pay | Admitting: Physician Assistant

## 2015-10-25 ENCOUNTER — Other Ambulatory Visit: Payer: Self-pay | Admitting: Physician Assistant

## 2015-10-28 NOTE — Telephone Encounter (Signed)
Done

## 2015-10-28 NOTE — Telephone Encounter (Signed)
Faxed

## 2015-11-26 ENCOUNTER — Other Ambulatory Visit: Payer: Self-pay | Admitting: Physician Assistant

## 2015-11-26 DIAGNOSIS — E119 Type 2 diabetes mellitus without complications: Secondary | ICD-10-CM

## 2015-12-06 ENCOUNTER — Other Ambulatory Visit: Payer: Self-pay | Admitting: Physician Assistant

## 2015-12-07 NOTE — Telephone Encounter (Signed)
Faxed and advised pt of need for f/up on VM.

## 2015-12-07 NOTE — Telephone Encounter (Signed)
Meds ordered this encounter  Medications  . clonazePAM (KLONOPIN) 2 MG tablet    Sig: TAKE ONE TABLET BY MOUTH ONCE DAILY AS NEEDED AND TWO AT BEDTIME    Dispense:  90 tablet    Refill:  0    Please advise patient that she needs OV and fasting labs with me for the next fill.

## 2015-12-21 ENCOUNTER — Encounter: Payer: Self-pay | Admitting: Physician Assistant

## 2015-12-21 ENCOUNTER — Ambulatory Visit: Payer: BLUE CROSS/BLUE SHIELD | Admitting: Physician Assistant

## 2015-12-21 VITALS — BP 128/72 | HR 87 | Temp 98.2°F | Resp 18 | Ht 69.0 in | Wt 208.0 lb

## 2015-12-21 DIAGNOSIS — M545 Low back pain: Secondary | ICD-10-CM

## 2015-12-21 DIAGNOSIS — F418 Other specified anxiety disorders: Secondary | ICD-10-CM | POA: Diagnosis not present

## 2015-12-21 DIAGNOSIS — G8929 Other chronic pain: Secondary | ICD-10-CM

## 2015-12-21 DIAGNOSIS — Z1231 Encounter for screening mammogram for malignant neoplasm of breast: Secondary | ICD-10-CM | POA: Diagnosis not present

## 2015-12-21 DIAGNOSIS — F329 Major depressive disorder, single episode, unspecified: Secondary | ICD-10-CM

## 2015-12-21 DIAGNOSIS — L301 Dyshidrosis [pompholyx]: Secondary | ICD-10-CM

## 2015-12-21 DIAGNOSIS — Z23 Encounter for immunization: Secondary | ICD-10-CM

## 2015-12-21 DIAGNOSIS — E119 Type 2 diabetes mellitus without complications: Secondary | ICD-10-CM | POA: Diagnosis not present

## 2015-12-21 DIAGNOSIS — F419 Anxiety disorder, unspecified: Secondary | ICD-10-CM

## 2015-12-21 LAB — MICROALBUMIN, URINE: Microalb, Ur: 0.3 mg/dL

## 2015-12-21 MED ORDER — AMITRIPTYLINE HCL 100 MG PO TABS: 100.0000 mg | ORAL_TABLET | Freq: Every day | ORAL | 1 refills | Status: DC

## 2015-12-21 MED ORDER — TRIAMCINOLONE ACETONIDE 0.1 % EX CREA: 1.0000 "application " | TOPICAL_CREAM | Freq: Two times a day (BID) | CUTANEOUS | 1 refills | Status: AC

## 2015-12-21 MED ORDER — NABUMETONE 750 MG PO TABS: 750.0000 mg | ORAL_TABLET | Freq: Two times a day (BID) | ORAL | 5 refills | Status: DC

## 2015-12-21 NOTE — Patient Instructions (Addendum)
     IF you received an x-ray today, you will receive an invoice from [facility]. Please contact [facility] at [phone] with questions or concerns regarding your invoice.   IF you received labwork today, you will receive an invoice from [facility]. Please contact [facility] at [phone] with questions or concerns regarding your invoice.   Our billing staff will not be able to assist you with questions regarding bills from these companies.  You will be contacted with the lab results as soon as they are available. The fastest way to get your results is to activate your My Chart account. Instructions are located on the last page of this paperwork. If you have not heard from us regarding the results in 2 weeks, please contact this office.    We recommend that you schedule a mammogram for breast cancer screening. Typically, you do not need a referral to do this. Please contact a local imaging center to schedule your mammogram.  [facility] - [phone]  *ask for the Radiology Department [facility] - [phone] or [phone]) [phone]  [facility] - [phone] [facility] - [phone] [facility] - [phone]) [phone]  *ask for the Radiology Department [facility] - [phone]  *ask for the Radiology Department [facility] - [phone]  *ask for the Mammography Department [facility] - [phone]     [facility]  [address], [place], [place] [address]  Phone: [phone]  [facility] [address], [place], [place] [address]  Phone: [phone]  [facility] [address], [place], [place] [address]  Phone: [phone]    [facility] [address] [phone]

## 2015-12-21 NOTE — Progress Notes (Signed)
Patient ID: Tracy Andrade, female    DOB: 17-Dec-1960, 55 y.o.   MRN: [id number]  PCP: Harrison Mons, PA-C  Subjective:   Chief Complaint  Patient presents with  . Follow-up  . Medication Refill    AMITRIPTYLINE,NABUMETONE    HPI Presents for medication refill.  Back pain is stable. Sometimes cramps, down into hips. Rests when it hurts.  Diarrhea from metformin resolved with addition of Invokana.  Review of Systems  Constitutional: Negative.  Negative for activity change, appetite change, fatigue and unexpected weight change.  HENT: Negative for congestion, dental problem, ear pain, hearing loss, mouth sores, postnasal drip, rhinorrhea, sneezing, sore throat, tinnitus and trouble swallowing.   Eyes: Negative for photophobia, pain, redness and visual disturbance.  Respiratory: Negative for cough, chest tightness, shortness of breath and wheezing.   Cardiovascular: Negative for chest pain, palpitations and leg swelling.  Gastrointestinal: Negative for abdominal pain, blood in stool, constipation, diarrhea, nausea and vomiting.  Endocrine: Negative.   Genitourinary: Negative for dysuria, frequency, hematuria and urgency.  Musculoskeletal: Negative for arthralgias, gait problem, myalgias and neck stiffness.  Skin: Negative for rash.  Allergic/Immunologic: Negative.   Neurological: Negative for dizziness, speech difficulty, weakness, light-headedness, numbness and headaches.  Hematological: Negative for adenopathy.  Psychiatric/Behavioral: Negative for confusion, decreased concentration and sleep disturbance. The patient is not nervous/anxious.        Patient Active Problem List   Diagnosis Date Noted  . Cough 06/08/2015  . Colon cancer screening 10/10/2014  . Nausea with vomiting 10/10/2014  . Diarrhea 10/10/2014  . Herpes simplex type 2 infection 08/06/2014  . Type 2 diabetes mellitus without complication ([place]) 29/56/2130  . Obesity (BMI 30-39.9) 07/12/2013  .  Post-menopausal 01/24/2013  . Hyperglycemia 01/24/2013  . Eczema 01/24/2013  . Stress incontinence 01/24/2013  . HIV disease ([place]) 05/10/2012  . Insomnia 05/10/2012  . Anxiety and depression 05/10/2012  . Chronic back pain 05/10/2012  . Condyloma acuminata 05/10/2012  . 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 GERD (gastroesophageal reflux disease) 02/02/2011     Prior to Admission medications   Medication Sig Start Date End Date Taking? Authorizing Provider  amitriptyline (ELAVIL) 100 MG tablet TAKE 1 TABLET BY MOUTH AT BEDTIME 09/20/15  Yes Theresa Wedel, PA-C  BAYER CONTOUR NEXT TEST test strip USE AS DIRECTED 09/29/15  Yes Vinita Prentiss, PA-C  Blood Glucose Monitoring Suppl (BAYER CONTOUR NEXT MONITOR) W/DEVICE KIT USE AS DIRECTED 12/21/14  Yes Carlos Heber, PA-C  buPROPion (WELLBUTRIN XL) 150 MG 24 hr tablet TAKE 1 TABLET BY MOUTH DAILY 07/02/15  Yes Ka Flammer, PA-C  clonazePAM (KLONOPIN) 2 MG tablet TAKE ONE TABLET BY MOUTH ONCE DAILY AS NEEDED AND TWO AT BEDTIME 12/07/15  Yes Juliya Magill, PA-C  cyclobenzaprine (FLEXERIL) 10 MG tablet TAKE 1 TABLET(10 MG) BY MOUTH THREE TIMES DAILY AS NEEDED FOR MUSCLE SPASMS 08/23/15  Yes Keijuan Schellhase, PA-C  GENVOYA 150-150-200-10 MG TABS tablet  07/29/15  Yes Historical Provider, MD  Lancets MISC Use to test blood sugar as directed. 08/25/14  Yes Dorianne Perret, PA-C  metFORMIN (GLUCOPHAGE-XR) 500 MG 24 hr tablet TAKE 2 TABLETS(1000 MG) BY MOUTH DAILY WITH BREAKFAST 11/29/15  Yes Chiquita Heckert, PA-C  nabumetone (RELAFEN) 750 MG tablet Take 1-2 tablets (750-1,500 mg total) by mouth 2 (two) times daily. 08/23/15  Yes Ezekeil Bethel, PA-C  PREZISTA 800 MG tablet TK 1 T PO  D WF WITH GENVOYA 07/29/15  Yes Historical Provider, MD  sertraline (ZOLOFT) 100 MG tablet TAKE 1& 1/2 TABLETS  BY MOUTH DAILY 06/12/15  Yes Mancel Bale, PA-C  traZODone (DESYREL) 100 MG tablet TAKE 2 TABLETS BY MOUTH AT BEDTIME 07/05/15  Yes Lynde Ludwig, PA-C  canagliflozin (INVOKANA)  100 MG TABS tablet Take 1 tablet (100 mg total) by mouth daily before breakfast. 08/23/15   Amya Hlad, PA-C  glipiZIDE (GLUCOTROL) 5 MG tablet Take 1 tablet (5 mg total) by mouth 2 (two) times daily before a meal. 09/02/15   Maegan Buller, PA-C  traMADol (ULTRAM) 50 MG tablet TAKE ONE TABLET BY MOUTH EVERY 8 HOURS AS NEEDED Patient not taking: Reported on 12/21/2015 11/30/14   Ulla Mckiernan, PA-C     No Known Allergies     Objective:  Physical Exam  Constitutional: She is oriented to person, place, and time. She appears well-developed and well-nourished. She is active and cooperative. No distress.  BP 128/72 (BP Location: Right Arm, Patient Position: Sitting, Cuff Size: Small)   Pulse 87   Temp 98.2 F (36.8 C) (Oral)   Resp 18   Ht 5' 9"  (1.753 m)   Wt 208 lb (94.3 kg)   SpO2 98%   BMI 30.72 kg/m   HENT:  Head: Normocephalic and atraumatic.  Right Ear: Hearing normal.  Left Ear: Hearing normal.  Eyes: Conjunctivae are normal. No scleral icterus.  Neck: Normal range of motion. Neck supple. No thyromegaly present.  Cardiovascular: Normal rate, regular rhythm and normal heart sounds.   Pulses:      Radial pulses are 2+ on the right side, and 2+ on the left side.  Pulmonary/Chest: Effort normal and breath sounds normal.  Lymphadenopathy:       Head (right side): No tonsillar, no preauricular, no posterior auricular and no occipital adenopathy present.       Head (left side): No tonsillar, no preauricular, no posterior auricular and no occipital adenopathy present.    She has no cervical adenopathy.       Right: No supraclavicular adenopathy present.       Left: No supraclavicular adenopathy present.  Neurological: She is alert and oriented to person, place, and time. No sensory deficit.  Skin: Skin is warm, dry and intact. Rash noted. Rash is maculopapular (excoriation, base of 3rd and 4th fingers, RIGHT hand. ). No cyanosis or erythema. Nails show no clubbing.  Psychiatric:  She has a normal mood and affect. Her speech is normal and behavior is normal.           Assessment & Plan:   1. Type 2 diabetes mellitus without complication, without long-term current use of insulin ([place]) Await labs. Adjust regimen as indicated by results. - Comprehensive metabolic panel - Hemoglobin A1c - Lipid panel - Microalbumin, urine - HM Diabetes Eye Exam - HM Diabetes Foot Exam  2. Anxiety and depression Stable. Controlled. - amitriptyline (ELAVIL) 100 MG tablet; Take 1 tablet (100 mg total) by mouth at bedtime.  Dispense: 90 tablet; Refill: 1  3. Chronic bilateral low back pain without sciatica Stable. Controlled. - nabumetone (RELAFEN) 750 MG tablet; Take 1-2 tablets (750-1,500 mg total) by mouth 2 (two) times daily.  Dispense: 120 tablet; Refill: 5  4. Eczema, dyshidrotic Skin hygiene reviewed. PRN steroid cream - triamcinolone cream (KENALOG) 0.1 %; Apply 1 application topically 2 (two) times daily.  Dispense: 45 g; Refill: 1  5. Encounter for screening mammogram for breast cancer - MM Digital Screening; Future  6. Need for prophylactic vaccination and inoculation against influenza - Flu Vaccine QUAD 36+ mos IM  Return in about 6 months (around 06/20/2016).   Fara Chute, PA-C Physician Assistant-Certified [facility]

## 2015-12-22 LAB — LIPID PANEL
Cholesterol: 156 mg/dL (ref 125–200)
HDL: 57 mg/dL (ref >=46)
LDL CALC: 87 mg/dL (ref <130)
TRIGLYCERIDES: 61 mg/dL (ref <150)
Total CHOL/HDL Ratio: 2.7 Ratio (ref <=5.0)
VLDL: 12 mg/dL (ref <30)

## 2015-12-22 LAB — COMPREHENSIVE METABOLIC PANEL
ALBUMIN: 3.9 g/dL (ref 3.6–5.1)
ALK PHOS: 72 U/L (ref 33–130)
ALT: 30 U/L — ABNORMAL HIGH (ref 6–29)
AST: 20 U/L (ref 10–35)
BILIRUBIN TOTAL: 0.2 mg/dL (ref 0.2–1.2)
BUN: 12 mg/dL (ref 7–25)
CALCIUM: 9.7 mg/dL (ref 8.6–10.4)
CO2: 28 mmol/L (ref 20–31)
CREATININE: 1 mg/dL (ref 0.50–1.05)
Chloride: 105 mmol/L (ref 98–110)
Glucose, Bld: 117 mg/dL — ABNORMAL HIGH (ref 65–99)
Potassium: 4.4 mmol/L (ref 3.5–5.3)
SODIUM: 140 mmol/L (ref 135–146)
TOTAL PROTEIN: 7.3 g/dL (ref 6.1–8.1)

## 2015-12-22 LAB — HEMOGLOBIN A1C
HEMOGLOBIN A1C: 7.6 % — AB (ref <5.7)
Mean Plasma Glucose: 171 mg/dL

## 2015-12-23 ENCOUNTER — Encounter: Payer: Self-pay | Admitting: Physician Assistant

## 2016-01-10 ENCOUNTER — Other Ambulatory Visit: Payer: Self-pay | Admitting: Physician Assistant

## 2016-01-10 DIAGNOSIS — M549 Dorsalgia, unspecified: Principal | ICD-10-CM

## 2016-01-10 DIAGNOSIS — G8929 Other chronic pain: Secondary | ICD-10-CM

## 2016-01-17 ENCOUNTER — Other Ambulatory Visit: Payer: Self-pay | Admitting: Physician Assistant

## 2016-01-18 NOTE — Telephone Encounter (Signed)
Rx printed at 104. Will bring to 102 after clinic.  Meds ordered this encounter  Medications  . clonazePAM (KLONOPIN) 2 MG tablet    Sig: TAKE ONE TABLET BY MOUTH ONCE DAILY AS NEEDED AND TWO AT BEDTIME    Dispense:  90 tablet    Refill:  0

## 2016-01-18 NOTE — Telephone Encounter (Signed)
Last RF 9/19, last OV 10/3. Pended.

## 2016-01-18 NOTE — Telephone Encounter (Signed)
Faxed

## 2016-02-07 ENCOUNTER — Other Ambulatory Visit: Payer: Self-pay | Admitting: Physician Assistant

## 2016-02-07 NOTE — Addendum Note (Signed)
Addended by: Sheppard PlumberBRIGGS, Dareen Gutzwiller A on: 02/07/2016 04:59 PM   Modules accepted: Orders

## 2016-02-07 NOTE — Telephone Encounter (Signed)
[facility] pharm LM asking for a Rx to be sent to them for pt's clonazepam. They had already sent req for sertraline electronically which I refilled. Clonazepam isn't due until 11/30 but they are requesting now since mail order. They stated pt's new ins requires her to get her meds through them.

## 2016-02-08 MED ORDER — CLONAZEPAM 2 MG PO TABS: ORAL_TABLET | ORAL | 0 refills | Status: DC

## 2016-02-08 NOTE — Addendum Note (Signed)
Addended by: Fernande BrasJEFFERY, Tracie Lindbloom S on: 02/08/2016 06:15 PM   Modules accepted: Orders

## 2016-02-08 NOTE — Telephone Encounter (Signed)
Meds ordered this encounter  Medications  . sertraline (ZOLOFT) 100 MG tablet    Sig: TAKE 1 AND 1/2 TABLETS(150 MG) BY MOUTH DAILY    Dispense:  135 tablet    Refill:  1  . clonazePAM (KLONOPIN) 2 MG tablet    Sig: TAKE ONE TABLET BY MOUTH ONCE DAILY AS NEEDED AND TWO AT BEDTIME    Dispense:  90 tablet    Refill:  0

## 2016-02-09 NOTE — Telephone Encounter (Signed)
Faxed

## 2016-02-11 ENCOUNTER — Other Ambulatory Visit: Payer: Self-pay | Admitting: Physician Assistant

## 2016-02-11 DIAGNOSIS — G8929 Other chronic pain: Secondary | ICD-10-CM

## 2016-02-11 DIAGNOSIS — M549 Dorsalgia, unspecified: Principal | ICD-10-CM

## 2016-02-12 NOTE — Telephone Encounter (Signed)
12/2015 last ov and refill 

## 2016-02-13 NOTE — Telephone Encounter (Signed)
Meds ordered this encounter  Medications  . cyclobenzaprine (FLEXERIL) 10 MG tablet    Sig: TAKE 1 TABLET(10 MG) BY MOUTH THREE TIMES DAILY AS NEEDED FOR MUSCLE SPASMS    Dispense:  90 tablet    Refill:  0

## 2016-03-13 ENCOUNTER — Other Ambulatory Visit: Payer: Self-pay | Admitting: Physician Assistant

## 2016-03-13 DIAGNOSIS — G8929 Other chronic pain: Secondary | ICD-10-CM

## 2016-03-13 DIAGNOSIS — M549 Dorsalgia, unspecified: Principal | ICD-10-CM

## 2016-03-14 ENCOUNTER — Other Ambulatory Visit: Payer: Self-pay | Admitting: Physician Assistant

## 2016-03-15 NOTE — Telephone Encounter (Signed)
Pt last OV 10/3 for anxiety, last RF 11/21.

## 2016-03-15 NOTE — Telephone Encounter (Signed)
Meds ordered this encounter  Medications  . clonazePAM (KLONOPIN) 2 MG tablet    Sig: TAKE ONE TABLET BY MOUTH ONCE DAILY AS NEEDED AND TWO AT BEDTIME    Dispense:  90 tablet    Refill:  0

## 2016-03-16 NOTE — Telephone Encounter (Signed)
Faxed to [facility]

## 2016-03-28 ENCOUNTER — Other Ambulatory Visit: Payer: Self-pay | Admitting: Physician Assistant

## 2016-03-31 ENCOUNTER — Telehealth: Payer: Self-pay

## 2016-03-31 NOTE — Telephone Encounter (Signed)
Pt is wanting to know why the request for klanazapan was denied   Best number [phone]

## 2016-04-05 ENCOUNTER — Other Ambulatory Visit: Payer: Self-pay | Admitting: Physician Assistant

## 2016-04-06 NOTE — Telephone Encounter (Signed)
Meds ordered this encounter  Medications  . clonazePAM (KLONOPIN) 2 MG tablet    Sig: TAKE ONE TABLET BY MOUTH ONCE DAILY AS NEEDED AND TWO AT BEDTIME    Dispense:  90 tablet    Refill:  0    

## 2016-04-07 NOTE — Telephone Encounter (Signed)
LMOVM for pt to return call and advise where she wants her prescription faxed. [facility] time [facility] [place]

## 2016-04-10 ENCOUNTER — Other Ambulatory Visit: Payer: Self-pay | Admitting: Physician Assistant

## 2016-04-12 ENCOUNTER — Telehealth: Payer: Self-pay | Admitting: Physician Assistant

## 2016-04-12 MED ORDER — LISINOPRIL 2.5 MG PO TABS: 2.5000 mg | ORAL_TABLET | Freq: Every day | ORAL | 0 refills | Status: DC

## 2016-04-12 MED ORDER — TRAZODONE HCL 100 MG PO TABS: 200.0000 mg | ORAL_TABLET | Freq: Every day | ORAL | 0 refills | Status: DC

## 2016-04-12 NOTE — Telephone Encounter (Signed)
Refilled trazadone and the lisinopril for 3 months.  Patient has appt scheduled for 4/3.  This will last her until that time.

## 2016-04-18 ENCOUNTER — Other Ambulatory Visit: Payer: Self-pay | Admitting: Physician Assistant

## 2016-04-18 DIAGNOSIS — E119 Type 2 diabetes mellitus without complications: Secondary | ICD-10-CM

## 2016-05-24 ENCOUNTER — Other Ambulatory Visit: Payer: Self-pay | Admitting: Physician Assistant

## 2016-05-24 NOTE — Telephone Encounter (Signed)
I will be in tomorrow (Thursday) to sign this.  Meds ordered this encounter  Medications  . clonazePAM (KLONOPIN) 2 MG tablet    Sig: TAKE ONE TABLET BY MOUTH ONCE DAILY AS NEEDED AND TWO AT BEDTIME    Dispense:  90 tablet    Refill:  0

## 2016-05-25 NOTE — Telephone Encounter (Signed)
Called to [facility] 

## 2016-06-20 ENCOUNTER — Ambulatory Visit: Payer: BLUE CROSS/BLUE SHIELD | Admitting: Physician Assistant

## 2016-06-26 ENCOUNTER — Other Ambulatory Visit: Payer: Self-pay | Admitting: Physician Assistant

## 2016-06-27 NOTE — Telephone Encounter (Signed)
05/24/16 last refill

## 2016-06-28 NOTE — Telephone Encounter (Signed)
Meds ordered this encounter  Medications  . clonazePAM (KLONOPIN) 2 MG tablet    Sig: TAKE ONE TABLET BY MOUTH ONCE DAILY AS NEEDED AND TWO AT BEDTIME    Dispense:  90 tablet    Refill:  0    

## 2016-06-28 NOTE — Telephone Encounter (Signed)
Script faxed to [facility] pharmacy

## 2016-07-04 ENCOUNTER — Other Ambulatory Visit: Payer: Self-pay | Admitting: Physician Assistant

## 2016-07-04 DIAGNOSIS — F419 Anxiety disorder, unspecified: Principal | ICD-10-CM

## 2016-07-04 DIAGNOSIS — F329 Major depressive disorder, single episode, unspecified: Secondary | ICD-10-CM

## 2016-07-04 NOTE — Telephone Encounter (Signed)
Please call this patient to schedule follow-up visit.  Meds ordered this encounter  Medications  . amitriptyline (ELAVIL) 100 MG tablet    Sig: TAKE ONE TABLET BY MOUTH AT BEDTIME    Dispense:  30 tablet    Refill:  0    Please notify patient that s/he needs an office visit +/- labsfor additional refills.

## 2016-08-01 ENCOUNTER — Other Ambulatory Visit: Payer: Managed Care, Other (non HMO)

## 2016-08-01 ENCOUNTER — Ambulatory Visit (INDEPENDENT_AMBULATORY_CARE_PROVIDER_SITE_OTHER): Payer: Managed Care, Other (non HMO) | Admitting: Physician Assistant

## 2016-08-01 ENCOUNTER — Ambulatory Visit: Payer: Managed Care, Other (non HMO)

## 2016-08-01 ENCOUNTER — Telehealth: Payer: Self-pay

## 2016-08-01 ENCOUNTER — Encounter: Payer: Self-pay | Admitting: Physician Assistant

## 2016-08-01 VITALS — BP 150/96 | HR 94 | Temp 97.8°F | Resp 20 | Ht 68.9 in | Wt 207.0 lb

## 2016-08-01 DIAGNOSIS — F419 Anxiety disorder, unspecified: Secondary | ICD-10-CM | POA: Diagnosis not present

## 2016-08-01 DIAGNOSIS — R030 Elevated blood-pressure reading, without diagnosis of hypertension: Secondary | ICD-10-CM | POA: Diagnosis not present

## 2016-08-01 DIAGNOSIS — F329 Major depressive disorder, single episode, unspecified: Secondary | ICD-10-CM | POA: Diagnosis not present

## 2016-08-01 DIAGNOSIS — R059 Cough, unspecified: Secondary | ICD-10-CM

## 2016-08-01 DIAGNOSIS — I10 Essential (primary) hypertension: Secondary | ICD-10-CM

## 2016-08-01 DIAGNOSIS — B20 Human immunodeficiency virus [HIV] disease: Secondary | ICD-10-CM | POA: Diagnosis not present

## 2016-08-01 DIAGNOSIS — G8929 Other chronic pain: Secondary | ICD-10-CM

## 2016-08-01 DIAGNOSIS — M545 Low back pain, unspecified: Secondary | ICD-10-CM

## 2016-08-01 DIAGNOSIS — R05 Cough: Secondary | ICD-10-CM | POA: Diagnosis not present

## 2016-08-01 DIAGNOSIS — N393 Stress incontinence (female) (male): Secondary | ICD-10-CM | POA: Diagnosis not present

## 2016-08-01 DIAGNOSIS — M549 Dorsalgia, unspecified: Secondary | ICD-10-CM

## 2016-08-01 DIAGNOSIS — E119 Type 2 diabetes mellitus without complications: Secondary | ICD-10-CM

## 2016-08-01 MED ORDER — NABUMETONE 750 MG PO TABS: 750.0000 mg | ORAL_TABLET | Freq: Two times a day (BID) | ORAL | 5 refills | Status: AC

## 2016-08-01 MED ORDER — CLONAZEPAM 2 MG PO TABS: ORAL_TABLET | ORAL | 0 refills | Status: DC

## 2016-08-01 MED ORDER — TRAZODONE HCL 100 MG PO TABS: 200.0000 mg | ORAL_TABLET | Freq: Every day | ORAL | 1 refills | Status: DC

## 2016-08-01 MED ORDER — AZELASTINE HCL 0.1 % NA SOLN: 2.0000 | Freq: Two times a day (BID) | NASAL | 0 refills | Status: AC

## 2016-08-01 MED ORDER — METFORMIN HCL ER 500 MG PO TB24: ORAL_TABLET | ORAL | 1 refills | Status: DC

## 2016-08-01 MED ORDER — AMOXICILLIN-POT CLAVULANATE 875-125 MG PO TABS: 1.0000 | ORAL_TABLET | Freq: Two times a day (BID) | ORAL | 0 refills | Status: AC

## 2016-08-01 MED ORDER — LISINOPRIL 5 MG PO TABS: 5.0000 mg | ORAL_TABLET | Freq: Every day | ORAL | 3 refills | Status: DC

## 2016-08-01 MED ORDER — CYCLOBENZAPRINE HCL 10 MG PO TABS: ORAL_TABLET | ORAL | 3 refills | Status: DC

## 2016-08-01 MED ORDER — AMITRIPTYLINE HCL 100 MG PO TABS: 100.0000 mg | ORAL_TABLET | Freq: Every day | ORAL | 1 refills | Status: DC

## 2016-08-01 MED ORDER — BENZONATATE 100 MG PO CAPS: 100.0000 mg | ORAL_CAPSULE | Freq: Three times a day (TID) | ORAL | 0 refills | Status: DC | PRN

## 2016-08-01 NOTE — Telephone Encounter (Signed)
Left message on patient's message to call back in regards to OV today. Patient did not go and get her xray.

## 2016-08-01 NOTE — Progress Notes (Signed)
Patient ID: Tracy Andrade, female    DOB: 1960/10/20, 56 y.o.   MRN: [id number]  PCP: Harrison Mons, PA-C  Chief Complaint  Patient presents with  . Follow-up    6 months    Subjective:   Presents for evaluation of her chronic issues, but is most concerned about a cough.  Cough x 1 month. Produces sputum. Some sore throat, runny/stuffy nose. Mucinex without benefit. Stress incontinence is worse. Wearing depends. No fever, chills. No heartburn, indigestion, increased belching. No CP, SOB. No hoarseness.  Stopped Wellbutrin and Zoloft. 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 Wasn't sure when to take them, in the morning or when she gets up.  Chelsea Aus, [place] at [facility], and had labs drawn 07/03/2016.  Has cut back on sweets. Improved glucose readings from 230's to the 150's. Taking medications without adverse effects.   Review of Systems As above.    Patient Active Problem List   Diagnosis Date Noted  . Cough 06/08/2015  . Herpes simplex type 2 infection 08/06/2014  . Type 2 diabetes mellitus without complication ([place]) 26/71/2458  . Obesity (BMI 30-39.9) 07/12/2013  . Post-menopausal 01/24/2013  . Eczema 01/24/2013  . Stress incontinence 01/24/2013  . HIV disease ([place]) 05/10/2012  . Insomnia 05/10/2012  . Anxiety and depression 05/10/2012  . Chronic back pain 05/10/2012  . Condyloma acuminata 05/10/2012  . 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 GERD (gastroesophageal reflux disease) 02/02/2011     Prior to Admission medications   Medication Sig Start Date End Date Taking? Authorizing Provider  amitriptyline (ELAVIL) 100 MG tablet TAKE ONE TABLET BY MOUTH AT BEDTIME 07/04/16  Yes Caressa Scearce, [place], PA-C  BAYER CONTOUR NEXT TEST test strip USE AS DIRECTED 09/29/15  Yes Chue Berkovich, PA-C  Blood Glucose Monitoring Suppl (BAYER CONTOUR NEXT MONITOR) W/DEVICE KIT USE AS DIRECTED 12/21/14  Yes Donnielle Addison, PA-C  clonazePAM (KLONOPIN) 2 MG tablet TAKE ONE TABLET BY MOUTH ONCE  DAILY AS NEEDED AND TWO AT BEDTIME 06/28/16  Yes Jazzmin Newbold, PA-C  cyclobenzaprine (FLEXERIL) 10 MG tablet TAKE 1 TABLET(10 MG) BY MOUTH THREE TIMES DAILY AS NEEDED FOR MUSCLE SPASMS 01/11/16  Yes India Jolin, PA-C  cyclobenzaprine (FLEXERIL) 10 MG tablet TAKE 1 TABLET(10 MG) BY MOUTH THREE TIMES DAILY AS NEEDED FOR MUSCLE SPASMS 03/15/16  Yes Harrison Mons, PA-C  GENVOYA 150-150-200-10 MG TABS tablet  07/29/15  Yes [provider]  Lancets MISC Use to test blood sugar as directed. 08/25/14  Yes Brinklee Cisse, PA-C  lisinopril (PRINIVIL,ZESTRIL) 2.5 MG tablet Take 1 tablet (2.5 mg total) by mouth daily. 04/12/16  Yes English, Colletta Maryland D, PA  metFORMIN (GLUCOPHAGE-XR) 500 MG 24 hr tablet TAKE 2 TABLETS(1000 MG) BY MOUTH DAILY WITH BREAKFAST**OFFICE VISIT NEEDED FOR REFILLS** 04/18/16  Yes McVey, Gelene Mink, PA-C  nabumetone (RELAFEN) 750 MG tablet Take 1-2 tablets (750-1,500 mg total) by mouth 2 (two) times daily. 12/21/15  Yes Maveryck Bahri, PA-C  PREZISTA 800 MG tablet TK 1 T PO  D WF WITH GENVOYA 07/29/15  Yes [provider]  traZODone (DESYREL) 100 MG tablet Take 2 tablets (200 mg total) by mouth at bedtime. 04/12/16  Yes English, Colletta Maryland D, PA  triamcinolone cream (KENALOG) 0.1 % Apply 1 application topically 2 (two) times daily. 12/21/15  Yes Ojas Coone, PA-C     No Known Allergies     Objective:  Physical Exam  Constitutional: She is oriented to person, place, and time. She appears well-developed and well-nourished. She is active and  cooperative. No distress.  BP (!) 150/96   Pulse 94   Temp 97.8 F (36.6 C) (Oral)   Resp 20   Ht 5' 8.9" (1.75 m)   Wt 207 lb (93.9 kg)   SpO2 98%   BMI 30.66 kg/m   HENT:  Head: Normocephalic and atraumatic.  Right Ear: Hearing normal.  Left Ear: Hearing normal.  Eyes: Conjunctivae are normal. No scleral icterus.  Neck: Normal range of motion. Neck supple. No thyromegaly present.  Cardiovascular: Normal  rate, regular rhythm and normal heart sounds.   Pulses:      Radial pulses are 2+ on the right side, and 2+ on the left side.  Pulmonary/Chest: Effort normal and breath sounds normal.  Lymphadenopathy:       Head (right side): No tonsillar, no preauricular, no posterior auricular and no occipital adenopathy present.       Head (left side): No tonsillar, no preauricular, no posterior auricular and no occipital adenopathy present.    She has no cervical adenopathy.       Right: No supraclavicular adenopathy present.       Left: No supraclavicular adenopathy present.  Neurological: She is alert and oriented to person, place, and time. No sensory deficit.  Skin: Skin is warm, dry and intact. No rash noted. No cyanosis or erythema. Nails show no clubbing.  Psychiatric: She has a normal mood and affect. Her speech is normal and behavior is normal.           Assessment & Plan:   Problem List Items Addressed This Visit    HIV disease ([place]) (Chronic)    Continue management with ID at [facility].      Anxiety and depression (Chronic)    Resume Wellbutrin and Zoloft. Advise that she take them when she gets up from sleep. If Zoloft seems to cause drowsiness, she can switch it to HS.      Relevant Medications   traZODone (DESYREL) 100 MG tablet   clonazePAM (KLONOPIN) 2 MG tablet   amitriptyline (ELAVIL) 100 MG tablet   Chronic back pain (Chronic)    Stable. Continue NSAID and muscle relaxer.      Relevant Medications   nabumetone (RELAFEN) 750 MG tablet   cyclobenzaprine (FLEXERIL) 10 MG tablet   Stress incontinence    Exacerbated by cough. Anticipate improvement when cough is resolved.      Type 2 diabetes mellitus without complication ([place]) - Primary    Await lab results. With improved lifestyle, anticipate improvement in control.      Relevant Medications   metFORMIN (GLUCOPHAGE-XR) 500 MG 24 hr tablet   lisinopril (PRINIVIL,ZESTRIL) 5 MG tablet   Other Relevant Orders    Hemoglobin A1c (Completed)   Lipid panel (Completed)   Cough    This has been an intermittent issue since 2017, not apparently changed by the addition of ACE-I. CXR today. Treat for suspected sinusitis. If persists, would consider stopping ACE-I.      Relevant Medications   azelastine (ASTELIN) 0.1 % nasal spray   benzonatate (TESSALON) 100 MG capsule   amoxicillin-clavulanate (AUGMENTIN) 875-125 MG tablet   Benign essential HTN    ACE-I initially started due to diabetes. Increase Lisinopril from 2.5 mg to 5 mg. If cough does not improve with treatment of suspected sinusitis, would change this to ARB.      Relevant Medications   lisinopril (PRINIVIL,ZESTRIL) 5 MG tablet    Other Visit Diagnoses    Elevated BP without diagnosis of hypertension  Relevant Medications   lisinopril (PRINIVIL,ZESTRIL) 5 MG tablet   Other Relevant Orders   Care order/instruction: (Completed)       Return in about 3 months (around 11/01/2016) for re-evaluation of diabetes and blood pressure.   Fara Chute, PA-C Primary [facility] at [facility]

## 2016-08-01 NOTE — Patient Instructions (Addendum)
Restart the Wellbutrin and the Zoloft. Take them when you get up. If you feel sleepy, try switching the Zoloft to bedtime.    IF you received an x-ray today, you will receive an invoice from [facility]. Please contact [facility] at [phone] with questions or concerns regarding your invoice.   IF you received labwork today, you will receive an invoice from [facility]. Please contact [facility] at [phone] with questions or concerns regarding your invoice.   Our billing staff will not be able to assist you with questions regarding bills from these companies.  You will be contacted with the lab results as soon as they are available. The fastest way to get your results is to activate your My Chart account. Instructions are located on the last page of this paperwork. If you have not heard from us regarding the results in 2 weeks, please contact this office.

## 2016-08-02 ENCOUNTER — Other Ambulatory Visit: Payer: Self-pay | Admitting: Physician Assistant

## 2016-08-02 ENCOUNTER — Ambulatory Visit (INDEPENDENT_AMBULATORY_CARE_PROVIDER_SITE_OTHER): Payer: Managed Care, Other (non HMO)

## 2016-08-02 DIAGNOSIS — R059 Cough, unspecified: Secondary | ICD-10-CM

## 2016-08-02 DIAGNOSIS — R05 Cough: Secondary | ICD-10-CM | POA: Diagnosis not present

## 2016-08-02 LAB — HEMOGLOBIN A1C
ESTIMATED AVERAGE GLUCOSE: 214 mg/dL
HEMOGLOBIN A1C: 9.1 % — AB (ref 4.8–5.6)

## 2016-08-02 LAB — LIPID PANEL
Chol/HDL Ratio: 3.6 ratio (ref 0.0–4.4)
Cholesterol, Total: 160 mg/dL (ref 100–199)
HDL: 44 mg/dL (ref >39)
LDL Calculated: 97 mg/dL (ref 0–99)
Triglycerides: 93 mg/dL (ref 0–149)
VLDL CHOLESTEROL CAL: 19 mg/dL (ref 5–40)

## 2016-08-02 NOTE — Progress Notes (Signed)
Patient was seen yesterday with cough x 4 weeks. Left without CXR. Contacted patient who returns today for CXR.

## 2016-08-03 DIAGNOSIS — I10 Essential (primary) hypertension: Secondary | ICD-10-CM | POA: Insufficient documentation

## 2016-08-03 NOTE — Assessment & Plan Note (Signed)
This has been an intermittent issue since 2017, not apparently changed by the addition of ACE-I. CXR today. Treat for suspected sinusitis. If persists, would consider stopping ACE-I.

## 2016-08-03 NOTE — Assessment & Plan Note (Signed)
Continue management with ID at [facility].

## 2016-08-03 NOTE — Assessment & Plan Note (Signed)
Exacerbated by cough. Anticipate improvement when cough is resolved.

## 2016-08-03 NOTE — Assessment & Plan Note (Signed)
Await lab results. With improved lifestyle, anticipate improvement in control.

## 2016-08-03 NOTE — Assessment & Plan Note (Signed)
Stable. Continue NSAID and muscle relaxer.

## 2016-08-03 NOTE — Assessment & Plan Note (Signed)
ACE-I initially started due to diabetes. Increase Lisinopril from 2.5 mg to 5 mg. If cough does not improve with treatment of suspected sinusitis, would change this to ARB.

## 2016-08-03 NOTE — Assessment & Plan Note (Signed)
Resume Wellbutrin and Zoloft. Advise that she take them when she gets up from sleep. If Zoloft seems to cause drowsiness, she can switch it to HS.

## 2016-08-04 ENCOUNTER — Encounter: Payer: Self-pay | Admitting: Physician Assistant

## 2016-08-15 ENCOUNTER — Other Ambulatory Visit: Payer: Self-pay | Admitting: Physician Assistant

## 2016-08-15 DIAGNOSIS — E119 Type 2 diabetes mellitus without complications: Secondary | ICD-10-CM

## 2016-08-18 ENCOUNTER — Other Ambulatory Visit: Payer: Self-pay | Admitting: Physician Assistant

## 2016-08-18 DIAGNOSIS — F419 Anxiety disorder, unspecified: Principal | ICD-10-CM

## 2016-08-18 DIAGNOSIS — F329 Major depressive disorder, single episode, unspecified: Secondary | ICD-10-CM

## 2016-08-19 NOTE — Telephone Encounter (Signed)
08/01/16 last ov and refill

## 2016-08-21 ENCOUNTER — Other Ambulatory Visit: Payer: Self-pay | Admitting: Physician Assistant

## 2016-08-21 DIAGNOSIS — F329 Major depressive disorder, single episode, unspecified: Secondary | ICD-10-CM

## 2016-08-21 DIAGNOSIS — F419 Anxiety disorder, unspecified: Principal | ICD-10-CM

## 2016-08-21 NOTE — Telephone Encounter (Signed)
Called to pharmacy 

## 2016-08-21 NOTE — Telephone Encounter (Signed)
Meds ordered this encounter  Medications  . clonazePAM (KLONOPIN) 2 MG tablet    Sig: TAKE 1 TAB ONCE DAILY AS NEEDED AND 2 AT BEDTIME    Dispense:  90 tablet    Refill:  0   This is early. Please ask patient why.

## 2016-08-26 ENCOUNTER — Other Ambulatory Visit: Payer: Self-pay | Admitting: Physician Assistant

## 2016-08-26 DIAGNOSIS — R059 Cough, unspecified: Secondary | ICD-10-CM

## 2016-08-26 DIAGNOSIS — R05 Cough: Secondary | ICD-10-CM

## 2016-09-11 ENCOUNTER — Ambulatory Visit: Payer: Managed Care, Other (non HMO) | Admitting: Physician Assistant

## 2016-09-18 ENCOUNTER — Encounter: Payer: Self-pay | Admitting: Physician Assistant

## 2016-09-18 ENCOUNTER — Ambulatory Visit (INDEPENDENT_AMBULATORY_CARE_PROVIDER_SITE_OTHER): Payer: Managed Care, Other (non HMO) | Admitting: Physician Assistant

## 2016-09-18 VITALS — BP 130/83 | HR 105 | Temp 98.2°F | Resp 18 | Ht 68.9 in | Wt 210.4 lb

## 2016-09-18 DIAGNOSIS — B20 Human immunodeficiency virus [HIV] disease: Secondary | ICD-10-CM

## 2016-09-18 DIAGNOSIS — F329 Major depressive disorder, single episode, unspecified: Secondary | ICD-10-CM | POA: Diagnosis not present

## 2016-09-18 DIAGNOSIS — F419 Anxiety disorder, unspecified: Secondary | ICD-10-CM

## 2016-09-18 DIAGNOSIS — N393 Stress incontinence (female) (male): Secondary | ICD-10-CM | POA: Diagnosis not present

## 2016-09-18 DIAGNOSIS — R059 Cough, unspecified: Secondary | ICD-10-CM

## 2016-09-18 DIAGNOSIS — E119 Type 2 diabetes mellitus without complications: Secondary | ICD-10-CM

## 2016-09-18 DIAGNOSIS — R05 Cough: Secondary | ICD-10-CM

## 2016-09-18 DIAGNOSIS — J189 Pneumonia, unspecified organism: Secondary | ICD-10-CM | POA: Diagnosis not present

## 2016-09-18 MED ORDER — AZITHROMYCIN 250 MG PO TABS: ORAL_TABLET | ORAL | 0 refills | Status: DC

## 2016-09-18 MED ORDER — SERTRALINE HCL 100 MG PO TABS: 50.0000 mg | ORAL_TABLET | Freq: Every day | ORAL | 3 refills | Status: DC

## 2016-09-18 MED ORDER — BUPROPION HCL ER (XL) 150 MG PO TB24: 150.0000 mg | ORAL_TABLET | Freq: Every day | ORAL | 3 refills | Status: AC

## 2016-09-18 NOTE — Progress Notes (Signed)
Subjective:    Patient ID: Tracy Andrade, female    DOB: 12/11/1960, 56 y.o.   MRN: [id number] PCP: Harrison Mons, PA-C  HPI Shaneque Merkle is a 56 year old female presenting for 6 week cough follow-up.  She was seen on 08/01/16 for management of her chronic medical conditions but was most concerned about an intermittent productive cough since 2017. She was treated with azelastine nasal spray, benzonatate, and augmentin for suspected sinusitis. CXR on 08/02/16 showed areas of patchy atelectasis in the lower lungs bilaterally - possible sterile atelectasis or patchy atelectatic pneumonia.   Today, she reports cough is unchanged. Cough is still productive and color of sputum varies - white, yellow, green. She is mostly coughing at night and reports it "makes her pee," which frustrates her. Benzonatate did not help. Endorses fatigue, but no recent changes in energy levels. Denies fever, chills, sneezing, rhinorrhea, pleuritic chest pain. Occasional SOB.  For the last 2.5 months, she has been working 12 days straight, 12 hour shifts, with one day off in between each stretch of shifts. She works two jobs. She feels like work is taking over her life and she is unable to rest. She would like a work note.  Chronic low back pain poorly controlled with Flexeril.   Patient Active Problem List   Diagnosis Date Noted  . Benign essential HTN 08/03/2016  . Cough 06/08/2015  . Herpes simplex type 2 infection 08/06/2014  . Type 2 diabetes mellitus without complication ([place]) 62/56/3893  . Obesity (BMI 30-39.9) 07/12/2013  . Post-menopausal 01/24/2013  . Eczema 01/24/2013  . Stress incontinence 01/24/2013  . HIV disease ([place]) 05/10/2012  . Insomnia 05/10/2012  . Anxiety and depression 05/10/2012  . Chronic back pain 05/10/2012  . Condyloma acuminata 05/10/2012  . 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 GERD (gastroesophageal reflux disease) 02/02/2011   Prior to Admission medications   Medication Sig Start  Date End Date Taking? Authorizing Provider  amitriptyline (ELAVIL) 100 MG tablet Take 1 tablet (100 mg total) by mouth at bedtime. 08/01/16  Yes Jeffery, Domingo Mend, PA-C  BAYER CONTOUR NEXT TEST test strip USE AS DIRECTED 09/29/15  Yes Jeffery, [place], PA-C  Blood Glucose Monitoring Suppl (BAYER CONTOUR NEXT MONITOR) W/DEVICE KIT USE AS DIRECTED 12/21/14  Yes Jeffery, [place], PA-C  clonazePAM (KLONOPIN) 2 MG tablet TAKE 1 TAB ONCE DAILY AS NEEDED AND 2 AT BEDTIME 08/21/16  Yes Jeffery, [place], PA-C  cyclobenzaprine (FLEXERIL) 10 MG tablet TAKE 1 TABLET(10 MG) BY MOUTH THREE TIMES DAILY AS NEEDED FOR MUSCLE SPASMS 08/01/16  Yes Harrison Mons, PA-C  GENVOYA 150-150-200-10 MG TABS tablet  07/29/15  Yes [provider]  Lancets MISC Use to test blood sugar as directed. 08/25/14  Yes Jeffery, [place], PA-C  lisinopril (PRINIVIL,ZESTRIL) 5 MG tablet Take 1 tablet (5 mg total) by mouth daily. 08/01/16  Yes Jeffery, [place], PA-C  metFORMIN (GLUCOPHAGE-XR) 500 MG 24 hr tablet TAKE 2 TABLETS(1000 MG) BY MOUTH DAILY WITH BREAKFAST 08/01/16  Yes Jeffery, [place]le, PA-C  PREZISTA 800 MG tablet TK 1 T PO  D WF WITH GENVOYA 07/29/15  Yes [provider]  traZODone (DESYREL) 100 MG tablet Take 2 tablets (200 mg total) by mouth at bedtime. 08/01/16  Yes Jeffery, [place]le, PA-C  triamcinolone cream (KENALOG) 0.1 % Apply 1 application topically 2 (two) times daily. 12/21/15  Yes Jeffery, [place], PA-C  azelastine (ASTELIN) 0.1 % nasal spray Place 2 sprays into both nostrils 2 (two) times daily. Use in each nostril as directed Patient not  taking: Reported on 09/18/2016 08/01/16   Harrison Mons, PA-C  azithromycin (ZITHROMAX) 250 MG tablet Take 2 tabs PO x 1 dose, then 1 tab PO QD x 4 days 09/18/16   Harrison Mons, PA-C  benzonatate (TESSALON) 100 MG capsule TAKE 1 TO 2 CAPSULES(100 TO 200 MG) BY MOUTH THREE TIMES DAILY AS NEEDED FOR COUGH Patient not taking: Reported on 09/18/2016 08/26/16   Harrison Mons, PA-C  buPROPion  (WELLBUTRIN XL) 150 MG 24 hr tablet Take 1 tablet (150 mg total) by mouth daily. 09/18/16   Harrison Mons, PA-C  nabumetone (RELAFEN) 750 MG tablet Take 1-2 tablets (750-1,500 mg total) by mouth 2 (two) times daily. Patient not taking: Reported on 09/18/2016 08/01/16   Harrison Mons, PA-C  sertraline (ZOLOFT) 100 MG tablet Take 0.5-1 tablets (50-100 mg total) by mouth daily. 09/18/16   Harrison Mons, PA-C     No Known Allergies   Review of Systems See above.     Objective:   Physical Exam  Constitutional: She appears well-developed and well-nourished.  BP 130/83 (BP Location: Right Arm, Patient Position: Sitting, Cuff Size: Normal)   Pulse (!) 105   Temp 98.2 F (36.8 C) (Oral)   Resp 18   Ht 5' 8.9" (1.75 m)   Wt 210 lb 6.4 oz (95.4 kg)   SpO2 97%   BMI 31.16 kg/m    HENT:  Head: Normocephalic and atraumatic.  Right Ear: External ear normal.  Left Ear: External ear normal.  Nose: Nose normal.  Eyes: EOM are normal.  Neck: Normal range of motion. Neck supple.  Cardiovascular: Normal rate, regular rhythm and normal heart sounds.   Pulses:      Radial pulses are 2+ on the right side, and 2+ on the left side.       Posterior tibial pulses are 2+ on the right side, and 2+ on the left side.  Pulmonary/Chest: Breath sounds normal.  Lymphadenopathy:    She has no cervical adenopathy.  Neurological: She is alert.  Skin: Skin is warm and dry.  Psychiatric: She has a normal mood and affect. Her behavior is normal.       Assessment & Plan:   1. Community acquired pneumonia, unspecified laterality 2. Cough Discussed potential etiologies include persistent infection, lisinopril, GERD, asthma. Rx azithromycin. Instructed pt to discontinue lisinopril x2 weeks if cough does not significantly improve following azithromycin. If cough subsides with discontinuation of lisinopril, will switch to ARB. Will reevaluate cough in 6 weeks.  - azithromycin (ZITHROMAX) 250 MG tablet; Take 2  tabs PO x 1 dose, then 1 tab PO QD x 4 days  Dispense: 6 tablet; Refill: 0  3. Stress incontinence Expect improvement with resolution of cough.  4. HIV disease ([place]) Follow-up in 6 weeks.  5. Type 2 diabetes mellitus without complication, without long-term current use of insulin ([facility]) Follow-up in 6 weeks.  6. Anxiety and depression Patient was advised to resume bupropion and sertraline at last visit 6 weeks ago but was not given prescriptions. Rx given today. Instructed to take half tab of sertraline x4 weeks before increasing to full dose. Follow-up in 6 weeks. - buPROPion (WELLBUTRIN XL) 150 MG 24 hr tablet; Take 1 tablet (150 mg total) by mouth daily.  Dispense: 90 tablet; Refill: 3 - sertraline (ZOLOFT) 100 MG tablet; Take 0.5-1 tablets (50-100 mg total) by mouth daily.  Dispense: 90 tablet; Refill: 3

## 2016-09-18 NOTE — Assessment & Plan Note (Signed)
[facility].

## 2016-09-18 NOTE — Assessment & Plan Note (Signed)
No improvement with Augmentin, tessalon perles, azelastine NS. Cover for atypicals with azithromycin.  If no better, stop ACEI. If still no better, would address GERD more aggressively.

## 2016-09-18 NOTE — Assessment & Plan Note (Signed)
Continues to work on [employer] eating.

## 2016-09-18 NOTE — Patient Instructions (Addendum)
  We recommend that you schedule a mammogram for breast cancer screening. Typically, you do not need a referral to do this. Please contact a local imaging center to schedule your mammogram.  [facility] - [phone]  *ask for the Radiology Department [facility] - [phone] or [phone]  [facility] - [phone] [facility] - [phone] [facility] - [phone]  *ask for the Radiology Department [facility] - [phone]  *ask for the Radiology Department [facility] - [phone]  *ask for the Mammography Department [facility] - [phone]   IF you received an x-ray today, you will receive an invoice from [facility]. Please contact [facility] at [phone] with questions or concerns regarding your invoice.   IF you received labwork today, you will receive an invoice from [facility]. Please contact [facility] at [phone] with questions or concerns regarding your invoice.   Our billing staff will not be able to assist you with questions regarding bills from these companies.  You will be contacted with the lab results as soon as they are available. The fastest way to get your results is to activate your My Chart account. Instructions are located on the last page of this paperwork. If you have not heard from us regarding the results in 2 weeks, please contact this office.    If the cough is not significantly improved when you complete the azithromycin, STOP the lisinopril. If the cough resolves, let me know. I'll replace it with something else. If it does not resolve in 2 weeks, resume it and let me know. We'll consider other causes of the cough.  Ask if you can bring Peanut to work. He may bring great joy to your clients!  When you start the sertraline (Zoloft), take 1/2 tablet each day. In 4 weeks, you can increase the dose to the whole  tablet, if you need to.

## 2016-09-18 NOTE — Progress Notes (Signed)
Patient ID: Tracy Andrade, female    DOB: 03-27-1960, 56 y.o.   MRN: [id number]  PCP: Harrison Mons, PA-C  Chief Complaint  Patient presents with  . Cough    per pt is here to follow up on pneumonia. Per pt is still coughing, and having shortness of breathe when walking and talking.  . Follow-up    Subjective:   Presents for evaluation of persistent cough.  Cough has been present since 2017 intermittently, but daily since mid-April 2018. I saw her 08/01/2016 for same. At that time, she related some URI-type symptoms, productive cough and stress incontinence. Because the intermittent cough started prior to the addition of ACEI, it was not considered the most likely cause of her cough. She was started on Augmentin for empiric treatment of suspected sinusitis, and CXR ordered. The CXR revealed patchy atelectasis in both lower lungs that could represent pneumonia. She completed the Augmentin without change in the cough. Tessalon perles and azelastine nasal spray also unhelpful.  The cough has not worsened. She continues to have stress urinary incontinence. No fever, chills. No URI-type symptoms.  At her visit in May she also related having stopped bupropion and sertraline several months prior, unsure whether to take them in the mornings, or when she woke up (she was working nights). Her mood had declined and she wanted to resume them, and I recommended that she restart them, taking the dose when she awakened each day. I thought that she had a supply of the medication at home and the pharmacy, but she did not, so has not resumed them yet.    Review of Systems As above.    Patient Active Problem List   Diagnosis Date Noted  . Benign essential HTN 08/03/2016  . Cough 06/08/2015  . Herpes simplex type 2 infection 08/06/2014  . Type 2 diabetes mellitus without complication ([place]) 53/29/9242  . Obesity (BMI 30-39.9) 07/12/2013  . Post-menopausal 01/24/2013  . Eczema 01/24/2013  .  Stress incontinence 01/24/2013  . HIV disease ([place]) 05/10/2012  . Insomnia 05/10/2012  . Anxiety and depression 05/10/2012  . Chronic back pain 05/10/2012  . Condyloma acuminata 05/10/2012  . 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 GERD (gastroesophageal reflux disease) 02/02/2011     Prior to Admission medications   Medication Sig Start Date End Date Taking? Authorizing Provider  amitriptyline (ELAVIL) 100 MG tablet Take 1 tablet (100 mg total) by mouth at bedtime. 08/01/16  Yes Zettie Gootee, Domingo Mend, PA-C  BAYER CONTOUR NEXT TEST test strip USE AS DIRECTED 09/29/15  Yes Zalia Hautala, PA-C  Blood Glucose Monitoring Suppl (BAYER CONTOUR NEXT MONITOR) W/DEVICE KIT USE AS DIRECTED 12/21/14  Yes Shafin Pollio, PA-C  clonazePAM (KLONOPIN) 2 MG tablet TAKE 1 TAB ONCE DAILY AS NEEDED AND 2 AT BEDTIME 08/21/16  Yes Haydn Cush, PA-C  cyclobenzaprine (FLEXERIL) 10 MG tablet TAKE 1 TABLET(10 MG) BY MOUTH THREE TIMES DAILY AS NEEDED FOR MUSCLE SPASMS 08/01/16  Yes Harrison Mons, PA-C  GENVOYA 150-150-200-10 MG TABS tablet  07/29/15  Yes [provider]  Lancets MISC Use to test blood sugar as directed. 08/25/14  Yes Deette Revak, PA-C  lisinopril (PRINIVIL,ZESTRIL) 5 MG tablet Take 1 tablet (5 mg total) by mouth daily. 08/01/16  Yes Enolia Koepke, PA-C  metFORMIN (GLUCOPHAGE-XR) 500 MG 24 hr tablet TAKE 2 TABLETS(1000 MG) BY MOUTH DAILY WITH BREAKFAST 08/01/16  Yes Casha Estupinan, PA-C  PREZISTA 800 MG tablet TK 1 T PO  D WF WITH GENVOYA 07/29/15  Yes [provider]  traZODone (DESYREL) 100 MG tablet Take 2 tablets (200 mg total) by mouth at bedtime. 08/01/16  Yes Hurschel Paynter, PA-C  triamcinolone cream (KENALOG) 0.1 % Apply 1 application topically 2 (two) times daily. 12/21/15  Yes Glendia Olshefski, PA-C  azelastine (ASTELIN) 0.1 % nasal spray Place 2 sprays into both nostrils 2 (two) times daily. Use in each nostril as directed Patient not taking: Reported on 09/18/2016 08/01/16   Harrison Mons, PA-C  benzonatate (TESSALON) 100 MG capsule TAKE 1 TO 2 CAPSULES(100 TO 200 MG) BY MOUTH THREE TIMES DAILY AS NEEDED FOR COUGH Patient not taking: Reported on 09/18/2016 08/26/16   Harrison Mons, PA-C  nabumetone (RELAFEN) 750 MG tablet Take 1-2 tablets (750-1,500 mg total) by mouth 2 (two) times daily. Patient not taking: Reported on 09/18/2016 08/01/16   Harrison Mons, PA-C     No Known Allergies     Objective:  Physical Exam  Constitutional: She is oriented to person, place, and time. She appears well-developed and well-nourished. She is active and cooperative. No distress.  BP 130/83 (BP Location: Right Arm, Patient Position: Sitting, Cuff Size: Normal)   Pulse (!) 105   Temp 98.2 F (36.8 C) (Oral)   Resp 18   Ht 5' 8.9" (1.75 m)   Wt 210 lb 6.4 oz (95.4 kg)   SpO2 97%   BMI 31.16 kg/m   HENT:  Head: Normocephalic and atraumatic.  Right Ear: Hearing normal.  Left Ear: Hearing normal.  Eyes: Conjunctivae are normal. No scleral icterus.  Neck: Normal range of motion. Neck supple. No thyromegaly present.  Cardiovascular: Normal rate, regular rhythm and normal heart sounds.   Pulses:      Radial pulses are 2+ on the right side, and 2+ on the left side.  Pulmonary/Chest: Effort normal and breath sounds normal.  Lymphadenopathy:       Head (right side): No tonsillar, no preauricular, no posterior auricular and no occipital adenopathy present.       Head (left side): No tonsillar, no preauricular, no posterior auricular and no occipital adenopathy present.    She has no cervical adenopathy.       Right: No supraclavicular adenopathy present.       Left: No supraclavicular adenopathy present.  Neurological: She is alert and oriented to person, place, and time. No sensory deficit.  Skin: Skin is warm, dry and intact. No rash noted. No cyanosis or erythema. Nails show no clubbing.  Psychiatric: She has a normal mood and affect. Her speech is normal and behavior is normal.            Assessment & Plan:   Problem List Items Addressed This Visit    HIV disease ([place]) (Chronic)    COntin[facility] management at [facility].      Relevant Medications   azithromycin (ZITHROMAX) 250 MG tablet   Anxiety and depression (Chronic)    Restart bupropion and sertraline.      Relevant Medications   buPROPion (WELLBUTRIN XL) 150 MG 24 hr tablet   sertraline (ZOLOFT) 100 MG tablet   Stress incontinence   Type 2 diabetes mellitus without complication (HCC)    Continues to work on healthier eating.      Cough    No improvement with Augmentin, tessalon perles, azelastine NS. Cover for atypicals with azithromycin.  If no better, stop ACEI. If still no better, would address GERD more aggressively.       Other Visit Diagnoses    Community  acquired pneumonia, unspecified laterality    -  Primary   Cover for atypicals with azithromycin.   Relevant Medications   azithromycin (ZITHROMAX) 250 MG tablet       Return in about 6 weeks (around 10/30/2016) for re-evaluation of diabetes, blood pressure, cough, mood.   Fara Chute, PA-C [facility] at [place]

## 2016-09-18 NOTE — Assessment & Plan Note (Signed)
Restart bupropion and sertraline.

## 2016-09-19 ENCOUNTER — Other Ambulatory Visit: Payer: Self-pay | Admitting: Physician Assistant

## 2016-09-19 DIAGNOSIS — F419 Anxiety disorder, unspecified: Principal | ICD-10-CM

## 2016-09-19 DIAGNOSIS — F329 Major depressive disorder, single episode, unspecified: Secondary | ICD-10-CM

## 2016-09-19 NOTE — Telephone Encounter (Signed)
Meds ordered this encounter  Medications  . clonazePAM (KLONOPIN) 2 MG tablet    Sig: TAKE 1 TAB ONCE DAILY AS NEEDED AND 2 AT BEDTIME    Dispense:  90 tablet    Refill:  0

## 2016-10-23 ENCOUNTER — Other Ambulatory Visit: Payer: Self-pay | Admitting: Family Medicine

## 2016-10-23 DIAGNOSIS — F419 Anxiety disorder, unspecified: Principal | ICD-10-CM

## 2016-10-23 DIAGNOSIS — F329 Major depressive disorder, single episode, unspecified: Secondary | ICD-10-CM

## 2016-10-23 NOTE — Telephone Encounter (Signed)
Please advise 

## 2016-10-23 NOTE — Telephone Encounter (Signed)
I have never seen this pt but will refill once on Tracy Andrade's behalf since she is out of town. Please [facility] in [facility] and alert pt. She will need an office visit with PCP Leotis ShamesJeffery before any further refills.

## 2016-10-25 ENCOUNTER — Other Ambulatory Visit: Payer: Self-pay | Admitting: Family Medicine

## 2016-10-25 DIAGNOSIS — F32A Depression, unspecified: Secondary | ICD-10-CM

## 2016-10-25 DIAGNOSIS — F329 Major depressive disorder, single episode, unspecified: Secondary | ICD-10-CM

## 2016-10-25 DIAGNOSIS — F419 Anxiety disorder, unspecified: Principal | ICD-10-CM

## 2016-11-27 ENCOUNTER — Other Ambulatory Visit: Payer: Self-pay | Admitting: Family Medicine

## 2016-11-27 DIAGNOSIS — F329 Major depressive disorder, single episode, unspecified: Secondary | ICD-10-CM

## 2016-11-27 DIAGNOSIS — F32A Depression, unspecified: Secondary | ICD-10-CM

## 2016-11-27 DIAGNOSIS — F419 Anxiety disorder, unspecified: Principal | ICD-10-CM

## 2016-11-27 NOTE — Telephone Encounter (Signed)
Meds ordered this encounter  Medications  . clonazePAM (KLONOPIN) 2 MG tablet    Sig: TAKE 1 TAB ONCE A DAY AS NEEDED AND 2 AT BEDTIME    Dispense:  90 tablet    Refill:  0

## 2016-11-28 ENCOUNTER — Telehealth: Payer: Self-pay

## 2016-12-04 DIAGNOSIS — Z006 Encounter for examination for normal comparison and control in clinical research program: Secondary | ICD-10-CM | POA: Insufficient documentation

## 2017-01-03 ENCOUNTER — Other Ambulatory Visit: Payer: Self-pay | Admitting: Physician Assistant

## 2017-01-03 DIAGNOSIS — F32A Depression, unspecified: Secondary | ICD-10-CM

## 2017-01-03 DIAGNOSIS — F329 Major depressive disorder, single episode, unspecified: Secondary | ICD-10-CM

## 2017-01-03 DIAGNOSIS — F419 Anxiety disorder, unspecified: Principal | ICD-10-CM

## 2017-01-03 NOTE — Telephone Encounter (Signed)
Meds ordered this encounter  Medications  . clonazePAM (KLONOPIN) 2 MG tablet    Sig: TAKE 1 TABLET BY MOUTH IN THE MORNING AND 2 TABLETS BY MOUTH AT BEDTIME    Dispense:  90 tablet    Refill:  0

## 2017-01-03 NOTE — Telephone Encounter (Signed)
Please advise 

## 2017-01-04 ENCOUNTER — Telehealth: Payer: Self-pay

## 2017-01-04 NOTE — Telephone Encounter (Signed)
Called pt and made aware that we called in rx for klonopin to [facility].

## 2017-02-01 ENCOUNTER — Other Ambulatory Visit: Payer: Self-pay | Admitting: Physician Assistant

## 2017-02-01 DIAGNOSIS — E119 Type 2 diabetes mellitus without complications: Secondary | ICD-10-CM

## 2017-02-07 ENCOUNTER — Other Ambulatory Visit: Payer: Self-pay | Admitting: Physician Assistant

## 2017-02-07 DIAGNOSIS — F32A Depression, unspecified: Secondary | ICD-10-CM

## 2017-02-07 DIAGNOSIS — F329 Major depressive disorder, single episode, unspecified: Secondary | ICD-10-CM

## 2017-02-07 DIAGNOSIS — F419 Anxiety disorder, unspecified: Principal | ICD-10-CM

## 2017-02-09 ENCOUNTER — Other Ambulatory Visit: Payer: Self-pay | Admitting: Physician Assistant

## 2017-02-09 DIAGNOSIS — F329 Major depressive disorder, single episode, unspecified: Secondary | ICD-10-CM

## 2017-02-09 DIAGNOSIS — F419 Anxiety disorder, unspecified: Principal | ICD-10-CM

## 2017-02-09 NOTE — Telephone Encounter (Signed)
Please advise 

## 2017-02-10 NOTE — Telephone Encounter (Signed)
Meds ordered this encounter  Medications  . amitriptyline (ELAVIL) 100 MG tablet    Sig: TAKE 1 TABLET(100 MG) BY MOUTH AT BEDTIME    Dispense:  90 tablet    Refill:  0  . traZODone (DESYREL) 100 MG tablet    Sig: TAKE 2 TABLETS(200 MG) BY MOUTH AT BEDTIME    Dispense:  180 tablet    Refill:  0

## 2017-02-10 NOTE — Telephone Encounter (Signed)
Rx sent electronically.  Meds ordered this encounter  Medications  . clonazePAM (KLONOPIN) 2 MG tablet    Sig: TAKE 1 TABLET BY MOUTH EVERY MORNING AND 2 TABLETS BY MOUTH EVERY NIGHT AT BEDTIME    Dispense:  90 tablet    Refill:  0    Please notify patient that s/he needs an office visit +/- labsfor additional refills.

## 2017-02-28 NOTE — Telephone Encounter (Signed)
Error-Close Encounter 

## 2017-03-24 ENCOUNTER — Telehealth: Payer: Self-pay | Admitting: Physician Assistant

## 2017-03-24 DIAGNOSIS — F32A Depression, unspecified: Secondary | ICD-10-CM

## 2017-03-24 DIAGNOSIS — F419 Anxiety disorder, unspecified: Principal | ICD-10-CM

## 2017-03-24 DIAGNOSIS — F329 Major depressive disorder, single episode, unspecified: Secondary | ICD-10-CM

## 2017-03-26 NOTE — Telephone Encounter (Signed)
Controlled substance along with antidepressant refill.

## 2017-03-28 NOTE — Telephone Encounter (Signed)
Called pt to let her know that the meds had been sent in but we would need to see her for an OV before these run out. We would not be able to refill them again without an Ov.  Thanks!

## 2017-03-28 NOTE — Telephone Encounter (Signed)
Pt is returning call - she thinks it is about medication, please call back

## 2017-03-28 NOTE — Telephone Encounter (Signed)
Rx sent electronically.  Meds ordered this encounter  Medications  . clonazePAM (KLONOPIN) 2 MG tablet    Sig: TAKE 1 TABLET BY MOUTH EVERY MORNING AND 2 TABLETS EVERY NIGHT AT BEDTIME    Dispense:  90 tablet    Refill:  0  . sertraline (ZOLOFT) 100 MG tablet    Sig: TAKE 1 AND 1/2 TABLETS(150 MG) BY MOUTH DAILY    Dispense:  135 tablet    Refill:  0    Please advise patient that she needs an OV before these run out.

## 2017-04-08 ENCOUNTER — Other Ambulatory Visit: Payer: Self-pay | Admitting: Physician Assistant

## 2017-04-08 DIAGNOSIS — G8929 Other chronic pain: Secondary | ICD-10-CM

## 2017-04-08 DIAGNOSIS — M549 Dorsalgia, unspecified: Principal | ICD-10-CM

## 2017-04-09 NOTE — Telephone Encounter (Signed)
Medication refill. No appointment has been scheduled. Thanks.

## 2017-04-10 NOTE — Telephone Encounter (Signed)
Please advise 

## 2017-04-15 ENCOUNTER — Other Ambulatory Visit: Payer: Self-pay | Admitting: Physician Assistant

## 2017-04-15 DIAGNOSIS — E119 Type 2 diabetes mellitus without complications: Secondary | ICD-10-CM

## 2017-04-18 ENCOUNTER — Ambulatory Visit: Payer: Managed Care, Other (non HMO) | Admitting: Physician Assistant

## 2017-04-18 ENCOUNTER — Other Ambulatory Visit: Payer: Self-pay

## 2017-04-18 ENCOUNTER — Encounter: Payer: Self-pay | Admitting: Physician Assistant

## 2017-04-18 ENCOUNTER — Ambulatory Visit (INDEPENDENT_AMBULATORY_CARE_PROVIDER_SITE_OTHER): Payer: Managed Care, Other (non HMO)

## 2017-04-18 VITALS — BP 128/88 | HR 102 | Temp 97.6°F | Resp 18 | Ht 68.9 in | Wt 215.6 lb

## 2017-04-18 DIAGNOSIS — F329 Major depressive disorder, single episode, unspecified: Secondary | ICD-10-CM

## 2017-04-18 DIAGNOSIS — F5101 Primary insomnia: Secondary | ICD-10-CM | POA: Diagnosis not present

## 2017-04-18 DIAGNOSIS — F32A Depression, unspecified: Secondary | ICD-10-CM

## 2017-04-18 DIAGNOSIS — M5441 Lumbago with sciatica, right side: Secondary | ICD-10-CM

## 2017-04-18 DIAGNOSIS — J019 Acute sinusitis, unspecified: Secondary | ICD-10-CM

## 2017-04-18 DIAGNOSIS — R05 Cough: Secondary | ICD-10-CM

## 2017-04-18 DIAGNOSIS — I10 Essential (primary) hypertension: Secondary | ICD-10-CM | POA: Diagnosis not present

## 2017-04-18 DIAGNOSIS — G8929 Other chronic pain: Secondary | ICD-10-CM

## 2017-04-18 DIAGNOSIS — E119 Type 2 diabetes mellitus without complications: Secondary | ICD-10-CM

## 2017-04-18 DIAGNOSIS — F419 Anxiety disorder, unspecified: Secondary | ICD-10-CM

## 2017-04-18 DIAGNOSIS — R059 Cough, unspecified: Secondary | ICD-10-CM

## 2017-04-18 DIAGNOSIS — B20 Human immunodeficiency virus [HIV] disease: Secondary | ICD-10-CM | POA: Diagnosis not present

## 2017-04-18 MED ORDER — GABAPENTIN 300 MG PO CAPS
300.0000 mg | ORAL_CAPSULE | Freq: Three times a day (TID) | ORAL | 3 refills | Status: DC
Start: 2017-04-18 — End: 2017-09-07

## 2017-04-18 MED ORDER — AMOXICILLIN-POT CLAVULANATE 875-125 MG PO TABS: 1.0000 | ORAL_TABLET | Freq: Two times a day (BID) | ORAL | 0 refills | Status: AC

## 2017-04-18 MED ORDER — BENZONATATE 100 MG PO CAPS: 100.0000 mg | ORAL_CAPSULE | Freq: Three times a day (TID) | ORAL | 0 refills | Status: AC | PRN

## 2017-04-18 MED ORDER — METAXALONE 800 MG PO TABS: 400.0000 mg | ORAL_TABLET | Freq: Three times a day (TID) | ORAL | 3 refills | Status: AC

## 2017-04-18 NOTE — Progress Notes (Signed)
Patient ID: Tracy Andrade, female    DOB: 19-Oct-1960, 57 y.o.   MRN: [id number]  PCP: Harrison Mons, PA-C  Chief Complaint  Patient presents with  . Depression    Depression scale score 13  . Diabetes  . Hypertension  . Cough    Pt states cough isn't any better. Pt states she is coughing up green mucous.  . Follow-up    Subjective:   Presents for evaluation of HTN, DM, chronic back pain and depression.  Cough resolved with discontinuation of lisinopril. Cough recurred about 4 weeks ago with nasal congestion, drainage. Stress urinary incontinence. CXR 08/01/2016 with areas of patchy atelectasis, and she was treated with azithromycin.  Home glucose 307 this morning, 227 about noon. Has not been taking her medications as prescribed, due to stress at work and different hours.  Low back pain is worsening, now radiating down the RIGHT leg. Cyclobenzaprine no longer effective. Increased knee pain with colder weather. Last lumbar films were 08/23/2015 and read as normal.  Lost her full time job. Looking to increase her hours at her current part time position, or another full time position that doesn't require so much standing.   Review of Systems As above. No CP, SOB, dizziness. No nausea, vomiting or diarrhea. No urinary urgency, frequency or burning. No increased thirst or hunger.  Depression screen [facility] 2/9 04/18/2017 09/18/2016 08/01/2016 12/21/2015 08/23/2015  Decreased Interest 2 0 0 0 0  Down, Depressed, Hopeless 2 0 0 0 0  PHQ - 2 Score 4 0 0 0 0  Altered sleeping 3 - - - -  Tired, decreased energy 3 - - - -  Change in appetite 1 - - - -  Feeling bad or failure about yourself  2 - - - -  Trouble concentrating 0 - - - -  Moving slowly or fidgety/restless 0 - - - -  Suicidal thoughts 0 - - - -  PHQ-9 Score 13 - - - -  Difficult doing work/chores Somewhat difficult - - - -     Patient Active Problem List   Diagnosis Date Noted  . Benign essential HTN 08/03/2016    . Cough 06/08/2015  . Herpes simplex type 2 infection 08/06/2014  . Type 2 diabetes mellitus without complication ([place]) 70/96/2836  . Obesity (BMI 30-39.9) 07/12/2013  . Post-menopausal 01/24/2013  . Eczema 01/24/2013  . Stress incontinence 01/24/2013  . HIV disease ([place]) 05/10/2012  . Insomnia 05/10/2012  . Anxiety and depression 05/10/2012  . Chronic back pain 05/10/2012  . Condyloma acuminata 05/10/2012  . 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 GERD (gastroesophageal reflux disease) 02/02/2011     Prior to Admission medications   Medication Sig Start Date End Date Taking? Authorizing Provider  amitriptyline (ELAVIL) 100 MG tablet TAKE 1 TABLET(100 MG) BY MOUTH AT BEDTIME 02/10/17  Yes Avarey Yaeger, PA-C  azelastine (ASTELIN) 0.1 % nasal spray Place 2 sprays into both nostrils 2 (two) times daily. Use in each nostril as directed 08/01/16  Yes Harrison Mons, PA-C  BAYER CONTOUR NEXT TEST test strip USE AS DIRECTED 09/29/15  Yes Jaremy Nosal, PA-C  Blood Glucose Monitoring Suppl (BAYER CONTOUR NEXT MONITOR) W/DEVICE KIT USE AS DIRECTED 12/21/14  Yes Selden Noteboom, PA-C  buPROPion (WELLBUTRIN XL) 150 MG 24 hr tablet Take 1 tablet (150 mg total) by mouth daily. 09/18/16  Yes Blakelee Allington, PA-C  clonazePAM (KLONOPIN) 2 MG tablet TAKE 1 TABLET BY MOUTH EVERY MORNING AND 2 TABLETS EVERY NIGHT  AT BEDTIME 03/28/17  Yes Gionni Vaca, PA-C  cyclobenzaprine (FLEXERIL) 10 MG tablet TAKE 1 TABLET(10 MG) BY MOUTH THREE TIMES DAILY AS NEEDED FOR MUSCLE SPASMS 04/12/17  Yes Harrison Mons, PA-C  GENVOYA 150-150-200-10 MG TABS tablet  07/29/15  Yes [provider]  Lancets MISC Use to test blood sugar as directed. 08/25/14  Yes Natavia Sublette, PA-C  metFORMIN (GLUCOPHAGE-XR) 500 MG 24 hr tablet TAKE 2 TABLETS(1000 MG) BY MOUTH DAILY WITH BREAKFAST 04/16/17  Yes Shaquan Puerta, PA-C  PREZISTA 800 MG tablet TK 1 T PO  D WF WITH GENVOYA 07/29/15  Yes [provider]  sertraline  (ZOLOFT) 100 MG tablet TAKE 1 AND 1/2 TABLETS(150 MG) BY MOUTH DAILY 03/28/17  Yes Endiya Klahr, PA-C  traZODone (DESYREL) 100 MG tablet TAKE 2 TABLETS(200 MG) BY MOUTH AT BEDTIME 02/10/17  Yes Zaley Talley, PA-C  triamcinolone cream (KENALOG) 0.1 % Apply 1 application topically 2 (two) times daily. 12/21/15  Yes Roseland Braun, PA-C  nabumetone (RELAFEN) 750 MG tablet Take 1-2 tablets (750-1,500 mg total) by mouth 2 (two) times daily. Patient not taking: Reported on 09/18/2016 08/01/16   Harrison Mons, PA-C     No Known Allergies     Objective:  Physical Exam  Constitutional: She is oriented to person, place, and time. She appears well-developed and well-nourished. She is active and cooperative. No distress.  BP 128/88 (BP Location: Left Arm, Patient Position: Sitting, Cuff Size: Large)   Pulse (!) 102   Temp 97.6 F (36.4 C) (Oral)   Resp 18   Ht 5' 8.9" (1.75 m)   Wt 215 lb 9.6 oz (97.8 kg)   SpO2 95%   BMI 31.93 kg/m   HENT:  Head: Normocephalic and atraumatic.  Right Ear: Hearing normal.  Left Ear: Hearing normal.  Eyes: Conjunctivae are normal. No scleral icterus.  Neck: Normal range of motion. Neck supple. No thyromegaly present.  Cardiovascular: Normal rate, regular rhythm and normal heart sounds.  Pulses:      Radial pulses are 2+ on the right side, and 2+ on the left side.  Pulmonary/Chest: Effort normal and breath sounds normal.  Musculoskeletal:       Cervical back: Normal.       Thoracic back: Normal.       Lumbar back: She exhibits tenderness and bony tenderness. She exhibits normal range of motion.  Lymphadenopathy:       Head (right side): No tonsillar, no preauricular, no posterior auricular and no occipital adenopathy present.       Head (left side): No tonsillar, no preauricular, no posterior auricular and no occipital adenopathy present.    She has no cervical adenopathy.       Right: No supraclavicular adenopathy present.       Left: No  supraclavicular adenopathy present.  Neurological: She is alert and oriented to person, place, and time. She has normal strength. No cranial nerve deficit or sensory deficit.  Reflex Scores:      Patellar reflexes are 2+ on the right side and 2+ on the left side.      Achilles reflexes are 2+ on the right side and 2+ on the left side. Skin: Skin is warm, dry and intact. No rash noted. No cyanosis or erythema. Nails show no clubbing.  Psychiatric: She has a normal mood and affect. Her speech is normal and behavior is normal.           Assessment & Plan:   Problem List Items Addressed This  Visit    HIV disease ([place]) (Chronic)    Continue per ID specialists.      Insomnia (Chronic)    Stable. No changes.      Anxiety and depression (Chronic)    Stable. No changes.      Chronic low back pain with right-sided sciatica    Await radiographs. Stop cyclobenzaprine, as it is ineffective. START Skelaxin and gabapentin.      Relevant Medications   gabapentin (NEURONTIN) 300 MG capsule   metaxalone (SKELAXIN) 800 MG tablet   Other Relevant Orders   DG Lumbar Spine Complete (Completed)   Type 2 diabetes mellitus without complication (HCC)    Uncontrolled, based on her home readings today. Await lab results. CONTINUE metformin ER 1000 mg daily. Anticipate need to INCREASE dose.      Relevant Orders   Hemoglobin A1c (Completed)   Lipid panel (Completed)   Comprehensive metabolic panel (Completed)   Cough - Primary    Resolved with D/C ACEI. Recurred with associated URI-type symptoms. Treat with benzonatate.      Relevant Medications   benzonatate (TESSALON) 100 MG capsule   Benign essential HTN    COntrolled. Intolerant to ACEI. Will need to start ARB.      Relevant Orders   Comprehensive metabolic panel (Completed)    Other Visit Diagnoses    Acute non-recurrent sinusitis, unspecified location       Treat with Augmentin, rest and hydration.   Relevant Medications    benzonatate (TESSALON) 100 MG capsule   amoxicillin-clavulanate (AUGMENTIN) 875-125 MG tablet       Return in about 3 months (around 07/17/2017), or if symptoms worsen or fail to improve, for re-evalaution of diabetes, blood pressure, etc.   Fara Chute, PA-C Primary Care at [place]

## 2017-04-18 NOTE — Patient Instructions (Addendum)
1. Take the metformin EVERY DAY. 2. STOP the cyclobenzaprine (Flexeril), since it's not working. START the metaxolone (Skelaxin) instead. 3. START the gabapentin. Because it can make you sleepy, start it just at bedtime.  Add a morning dose, then a midday dose as you are able. 4. START the Augmentin for your sinus infection and the benzonatate for cough.    We recommend that you schedule a mammogram for breast cancer screening. Typically, you do not need a referral to do this. Please contact a local imaging center to schedule your mammogram.  [facility] - [phone]) [phone]  *ask for the Radiology Department [facility] - [phone] or [phone]  [facility] - [phone] [facility] - [phone] [facility] - [phone]  *ask for the Radiology Department [facility] - [phone]  *ask for the Radiology Department [facility] - [phone]  *ask for the Mammography Department [facility] - [phone]   IF you received an x-ray today, you will receive an invoice from [facility]. Please contact [facility] at [phone] with questions or concerns regarding your invoice.   IF you received labwork today, you will receive an invoice from [facility]. Please contact [facility] at [phone] with questions or concerns regarding your invoice.   Our billing staff will not be able to assist you with questions regarding bills from these companies.  You will be contacted with the lab results as soon as they are available. The fastest way to get your results is to activate your My Chart account. Instructions are located on the last page of this paperwork. If you have not heard from us regarding the results in 2 weeks, please contact this office.

## 2017-04-19 LAB — LIPID PANEL
CHOLESTEROL TOTAL: 159 mg/dL (ref 100–199)
Chol/HDL Ratio: 3.2 ratio (ref 0.0–4.4)
HDL: 49 mg/dL (ref >39)
LDL Calculated: 97 mg/dL (ref 0–99)
TRIGLYCERIDES: 65 mg/dL (ref 0–149)
VLDL Cholesterol Cal: 13 mg/dL (ref 5–40)

## 2017-04-19 LAB — HEMOGLOBIN A1C
ESTIMATED AVERAGE GLUCOSE: 237 mg/dL
Hgb A1c MFr Bld: 9.9 % — ABNORMAL HIGH (ref 4.8–5.6)

## 2017-04-19 LAB — COMPREHENSIVE METABOLIC PANEL
A/G RATIO: 1.3 (ref 1.2–2.2)
ALBUMIN: 4.4 g/dL (ref 3.5–5.5)
ALT: 29 IU/L (ref 0–32)
AST: 21 IU/L (ref 0–40)
Alkaline Phosphatase: 105 IU/L (ref 39–117)
BUN / CREAT RATIO: 11 (ref 9–23)
BUN: 10 mg/dL (ref 6–24)
Bilirubin Total: <0.2 mg/dL (ref 0.0–1.2)
CALCIUM: 9.1 mg/dL (ref 8.7–10.2)
CO2: 24 mmol/L (ref 20–29)
CREATININE: 0.91 mg/dL (ref 0.57–1.00)
Chloride: 104 mmol/L (ref 96–106)
GFR calc non Af Amer: 70 mL/min/{1.73_m2} (ref >59)
GFR, EST AFRICAN AMERICAN: 81 mL/min/{1.73_m2} (ref >59)
GLOBULIN, TOTAL: 3.4 g/dL (ref 1.5–4.5)
Glucose: 157 mg/dL — ABNORMAL HIGH (ref 65–99)
POTASSIUM: 4.6 mmol/L (ref 3.5–5.2)
SODIUM: 140 mmol/L (ref 134–144)
Total Protein: 7.8 g/dL (ref 6.0–8.5)

## 2017-04-20 ENCOUNTER — Ambulatory Visit: Payer: Managed Care, Other (non HMO) | Admitting: Physician Assistant

## 2017-04-21 NOTE — Assessment & Plan Note (Signed)
Resolved with D/C ACEI. Recurred with associated URI-type symptoms. Treat with benzonatate.

## 2017-04-21 NOTE — Assessment & Plan Note (Signed)
Uncontrolled, based on her home readings today. Await lab results. CONTINUE metformin ER 1000 mg daily. Anticipate need to INCREASE dose.

## 2017-04-21 NOTE — Assessment & Plan Note (Signed)
Continue per ID specialists.

## 2017-04-21 NOTE — Assessment & Plan Note (Signed)
Stable No changes 

## 2017-04-21 NOTE — Assessment & Plan Note (Addendum)
Await radiographs. Stop cyclobenzaprine, as it is ineffective. START Skelaxin and gabapentin.

## 2017-04-21 NOTE — Assessment & Plan Note (Signed)
COntrolled. Intolerant to ACEI. Will need to start ARB.

## 2017-04-23 ENCOUNTER — Telehealth: Payer: Self-pay | Admitting: *Deleted

## 2017-04-23 NOTE — Telephone Encounter (Signed)
Chelle,  I spoke with patient she will increase her Metformin, she stated she has been taking it correctly.      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

## 2017-04-23 NOTE — Telephone Encounter (Signed)
Ok, we'll do our best to manage until she has new coverage.

## 2017-04-23 NOTE — Telephone Encounter (Signed)
-----   Message from [place] Jeff[place], [place] sent at 04/22/2017  3:41 PM EST ----- Please call this patient (there is also a result note regarding her radiographs, I am sorry that I didn't put them together!).  Her diabetes is way out of control. Please verify whether she has been taking the metformin regularly. If not, it's so very important that she does. If she is, then we need to INCREASE the dose to 2000 mg daily (she can take 4 of the tablets she has, either all together one time a day, or 2 tablets twice a day).

## 2017-04-26 ENCOUNTER — Other Ambulatory Visit: Payer: Self-pay | Admitting: Physician Assistant

## 2017-04-26 DIAGNOSIS — F329 Major depressive disorder, single episode, unspecified: Secondary | ICD-10-CM

## 2017-04-26 DIAGNOSIS — F419 Anxiety disorder, unspecified: Principal | ICD-10-CM

## 2017-04-26 NOTE — Telephone Encounter (Signed)
Requesting refill of Klonopin  [name] 04/18/17 with Leotis Shames. Jeffery  LRF 03/28/17  #90  0 refills  To be filled at: [phone] - [place], [place] - [address]

## 2017-04-26 NOTE — Telephone Encounter (Signed)
Please advise 

## 2017-05-02 ENCOUNTER — Other Ambulatory Visit: Payer: Self-pay | Admitting: Physician Assistant

## 2017-05-02 DIAGNOSIS — F419 Anxiety disorder, unspecified: Principal | ICD-10-CM

## 2017-05-02 DIAGNOSIS — F329 Major depressive disorder, single episode, unspecified: Secondary | ICD-10-CM

## 2017-05-03 NOTE — Telephone Encounter (Signed)
Klonopin refill Last OV: 04/18/17 Last Refill:03/28/17 Pharmacy:[facility] [address]

## 2017-05-04 ENCOUNTER — Other Ambulatory Visit: Payer: Self-pay | Admitting: Physician Assistant

## 2017-05-04 DIAGNOSIS — F329 Major depressive disorder, single episode, unspecified: Secondary | ICD-10-CM

## 2017-05-04 DIAGNOSIS — F419 Anxiety disorder, unspecified: Principal | ICD-10-CM

## 2017-05-04 NOTE — Telephone Encounter (Signed)
Rx sent electronically.  Meds ordered this encounter  Medications  . clonazePAM (KLONOPIN) 2 MG tablet    Sig: TAKE 1 TABLET BY MOUTH EVERY MORNING AND 2 TABLETS EVERY NIGHT AT BEDTIME    Dispense:  90 tablet    Refill:  0

## 2017-05-04 NOTE — Telephone Encounter (Signed)
Please advise 

## 2017-05-04 NOTE — Telephone Encounter (Signed)
Elavil and Desyrel refill requests Last OV 11/01/16 [facility] [phone]  [place]  [id number] [facility]

## 2017-05-05 NOTE — Telephone Encounter (Signed)
Med refill req Elavil, Des[facility]rel [facility]

## 2017-05-07 NOTE — Telephone Encounter (Signed)
Meds ordered this encounter  Medications  . amitriptyline (ELAVIL) 100 MG tablet    Sig: TAKE 1 TABLET(100 MG) BY MOUTH AT BEDTIME    Dispense:  90 tablet    Refill:  0  . traZODone (DESYREL) 100 MG tablet    Sig: TAKE 2 TABLETS(200 MG) BY MOUTH AT BEDTIME    Dispense:  180 tablet    Refill:  0    

## 2017-05-07 NOTE — Telephone Encounter (Signed)
Brooke with [facility] following up on request for the pt of her amitriptyline (ELAVIL) 100 MG tablet traZODone (DESYREL) 100 MG tablet   [id number] [place], [place] - [address] AT [facility] [phone] (Phone) [phone] (Fax)

## 2017-05-23 ENCOUNTER — Other Ambulatory Visit: Payer: Self-pay | Admitting: Physician Assistant

## 2017-05-23 ENCOUNTER — Other Ambulatory Visit: Payer: Self-pay

## 2017-05-23 DIAGNOSIS — E119 Type 2 diabetes mellitus without complications: Secondary | ICD-10-CM

## 2017-05-23 MED ORDER — METFORMIN HCL ER 500 MG PO TB24: ORAL_TABLET | ORAL | 0 refills | Status: DC

## 2017-06-15 ENCOUNTER — Other Ambulatory Visit: Payer: Self-pay | Admitting: Physician Assistant

## 2017-06-15 DIAGNOSIS — F419 Anxiety disorder, unspecified: Principal | ICD-10-CM

## 2017-06-15 DIAGNOSIS — F329 Major depressive disorder, single episode, unspecified: Secondary | ICD-10-CM

## 2017-06-15 DIAGNOSIS — F32A Depression, unspecified: Secondary | ICD-10-CM

## 2017-06-15 NOTE — Telephone Encounter (Signed)
Last OV: 04/18/17 PCP: Leotis ShamesJeffery

## 2017-06-15 NOTE — Telephone Encounter (Signed)
[facility]

## 2017-06-18 ENCOUNTER — Other Ambulatory Visit: Payer: Self-pay | Admitting: Physician Assistant

## 2017-06-18 DIAGNOSIS — E119 Type 2 diabetes mellitus without complications: Secondary | ICD-10-CM

## 2017-06-18 MED ORDER — METFORMIN HCL ER 500 MG PO TB24: ORAL_TABLET | ORAL | 0 refills | Status: DC

## 2017-06-18 NOTE — Telephone Encounter (Signed)
Copied from CRM [id number]. Topic: Quick Communication - Rx Refill/Question >> Jun 18, 2017 11:54 AM Landry MellowFoltz, Melissa J wrote: Medication: metFORMIN (GLUCOPHAGE-XR) 500 MG 24 hr tablet Has the patient contacted their pharmacy? Yes.   (Agent: If no, request that the patient contact the pharmacy for the refill.) Preferred Pharmacy (with phone number or street name): [facility] - [phone]  Agent: Please be advised that RX refills may take up to 3 business days. We ask that you follow-up with your pharmacy. Pt says dose was changed to 1000mg  two times a day Pt is out of meds

## 2017-06-18 NOTE — Telephone Encounter (Signed)
Refill of metformin  [name] 04/18/17  C. Jeffrey  Pt states dose was increased to 1000mg  (04/23/17) and she is out. Pt states she does not have insurance and is asking for refill without appt.

## 2017-06-21 ENCOUNTER — Telehealth: Payer: Self-pay | Admitting: Physician Assistant

## 2017-06-21 NOTE — Telephone Encounter (Signed)
Copied from CRM [id number]. Topic: Quick Communication - Rx Refill/Question >> Jun 21, 2017  9:54 AM Rudi CocoLathan, Lititia Sen M, NT wrote: Medication: metFORMIN (GLUCOPHAGE-XR) 500 MG 24 hr tablet [[id number]][[id number]]  Has the patient contacted their pharmacy? yes (Agent: If no, request that the patient contact the pharmacy for the refill.) Preferred Pharmacy (with phone number or street name): [facility] [address] - [place], [place] - [id number] [place] [address] [place] [id number] Phone: [phone] Fax: [phone]   Agent: Please be advised that RX refills may take up to 3 business days. We ask that you follow-up with your pharmacy.

## 2017-06-21 NOTE — Telephone Encounter (Signed)
[facility] message [facility]. If she would like to change pharmacy, she can call and have it transferred.

## 2017-06-25 ENCOUNTER — Encounter: Payer: Self-pay | Admitting: Physician Assistant

## 2017-07-16 ENCOUNTER — Other Ambulatory Visit: Payer: Self-pay | Admitting: Physician Assistant

## 2017-07-16 DIAGNOSIS — E119 Type 2 diabetes mellitus without complications: Secondary | ICD-10-CM

## 2017-07-17 NOTE — Telephone Encounter (Signed)
Zoloft 100 mg tablet refill request  I get a very high drug interaction warning between the Sertraline and Elavil   [facility] [phone] - specialty pharmacy - [place], [place] - [address] at [facility]  Pt of [employer]

## 2017-07-18 ENCOUNTER — Telehealth: Payer: Self-pay | Admitting: Physician Assistant

## 2017-07-18 ENCOUNTER — Other Ambulatory Visit: Payer: Self-pay | Admitting: Physician Assistant

## 2017-07-18 DIAGNOSIS — F329 Major depressive disorder, single episode, unspecified: Secondary | ICD-10-CM

## 2017-07-18 DIAGNOSIS — F419 Anxiety disorder, unspecified: Principal | ICD-10-CM

## 2017-07-18 MED ORDER — SERTRALINE HCL 100 MG PO TABS: ORAL_TABLET | ORAL | 3 refills | Status: AC

## 2017-07-18 MED ORDER — CLONAZEPAM 2 MG PO TABS: ORAL_TABLET | ORAL | 0 refills | Status: AC

## 2017-07-18 NOTE — Telephone Encounter (Signed)
Please advise 

## 2017-07-18 NOTE — Telephone Encounter (Signed)
Requesting refill of Klonopin and Zoloft  [name] 04/18/17 C. Tinnie Gens  LRF Klonopin 06/16/17   #90   0 refills  LRF of Zoloft  03/28/17    #135   0 refills  Was to have f/u appt 07/17/17; Called pt to schedule appt; Pt states is unable to make appt as "I have no insurance and can't afford to come in."   #[phone]

## 2017-07-18 NOTE — Telephone Encounter (Unsigned)
Copied from CRM [id number]. Topic: Quick Communication - See Telephone Encounter >> Jul 18, 2017  1:42 PM Waymon Amato wrote: Pt is calling to get a refill on clonazepam and sertraline  [facility] [phone]  Best number [phone]

## 2017-07-18 NOTE — Telephone Encounter (Signed)
Rx sent electronically.  Meds ordered this encounter  Medications  . clonazePAM (KLONOPIN) 2 MG tablet    Sig: TAKE 1 TABLET BY MOUTH EVERY MORNING AND 2 TABLETS BY MOUTH EVERY NIGHT AT BEDTIME    Dispense:  90 tablet    Refill:  0  . sertraline (ZOLOFT) 100 MG tablet    Sig: TAKE 1 AND 1/2 TABLETS(150 MG) BY MOUTH DAILY    Dispense:  135 tablet    Refill:  3    Please make sure that this patient knows that I will not be able to prescribe for her from my new office until I see here there.

## 2017-07-18 NOTE — Telephone Encounter (Unsigned)
Copied from CRM #[id number]. Topic: Quick Communication - See Telephone Encounter °>> Jul 18, 2017  1:42 PM Burton, Donna F wrote: °Pt is calling to get a refill on clonazepam and sertraline  [phone] ° °Best number [phone] °

## 2017-07-19 ENCOUNTER — Ambulatory Visit: Payer: Managed Care, Other (non HMO) | Admitting: Family Medicine

## 2017-07-19 NOTE — Progress Notes (Deleted)
No chief complaint on file.   HPI  4 review of systems  Past Medical History:  Diagnosis Date  . Anxiety   . Arthritis   . Depression   . DM (diabetes mellitus) ([place])   . Eczema   . GERD (gastroesophageal reflux disease)   . Herpes simplex type 2 infection   . HIV infection ([place])   . Insomnia     Current Outpatient Medications  Medication Sig Dispense Refill  . amitriptyline (ELAVIL) 100 MG tablet TAKE 1 TABLET(100 MG) BY MOUTH AT BEDTIME 90 tablet 0  . azelastine (ASTELIN) 0.1 % nasal spray Place 2 sprays into both nostrils 2 (two) times daily. Use in each nostril as directed 30 mL 0  . BAYER CONTOUR NEXT TEST test strip USE AS DIRECTED 100 each 10  . benzonatate (TESSALON) 100 MG capsule Take 1-2 capsules (100-200 mg total) by mouth 3 (three) times daily as needed for cough. 40 capsule 0  . Blood Glucose Monitoring Suppl (BAYER CONTOUR NEXT MONITOR) W/DEVICE KIT USE AS DIRECTED 1 kit 0  . buPROPion (WELLBUTRIN XL) 150 MG 24 hr tablet Take 1 tablet (150 mg total) by mouth daily. 90 tablet 3  . clonazePAM (KLONOPIN) 2 MG tablet TAKE 1 TABLET BY MOUTH EVERY MORNING AND 2 TABLETS BY MOUTH EVERY NIGHT AT BEDTIME 90 tablet 0  . gabapentin (NEURONTIN) 300 MG capsule Take 1 capsule (300 mg total) by mouth 3 (three) times daily. 90 capsule 3  . GENVOYA 150-150-200-10 MG TABS tablet   5  . Lancets MISC Use to test blood sugar as directed. 100 each 12  . metaxalone (SKELAXIN) 800 MG tablet Take 0.5-1 tablets (400-800 mg total) by mouth 3 (three) times daily. 180 tablet 3  . metFORMIN (GLUCOPHAGE-XR) 500 MG 24 hr tablet TAKE 2 TABLETS BY MOUTH DAILY WITH BREAKFAST  Office visit needed 60 tablet 0  . nabumetone (RELAFEN) 750 MG tablet Take 1-2 tablets (750-1,500 mg total) by mouth 2 (two) times daily. (Patient not taking: Reported on 09/18/2016) 120 tablet 5  . PREZISTA 800 MG tablet TK 1 T PO  D WF WITH GENVOYA  5  . sertraline (ZOLOFT) 100 MG tablet TAKE 1 AND 1/2 TABLETS(150 MG) BY MOUTH  DAILY 135 tablet 3  . traZODone (DESYREL) 100 MG tablet TAKE 2 TABLETS(200 MG) BY MOUTH AT BEDTIME 180 tablet 0  . triamcinolone cream (KENALOG) 0.1 % Apply 1 application topically 2 (two) times daily. 45 g 1   No current facility-administered medications for this visit.     Allergies:  Allergies  Allergen Reactions  . Ace Inhibitors Cough    Past Surgical History:  Procedure Laterality Date  . ABDOMINAL HYSTERECTOMY    . COLON SURGERY      Social History   Socioeconomic History  . Marital status: Single    Spouse name: n/a  . Number of children: 1  . Years of education: Not on file  . Highest education level: Not on file  Occupational History  . 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 Occupation: Substance Dance movement psychotherapist  Social Needs  . Financial resource strain: Not on file  . Food insecurity:    Worry: Not on file    Inability: Not on file  . Transportation needs:    Medical: Not on file    Non-medical: Not on file  Tobacco Use  . Smoking status: Former Research scientist (life sciences)  . Smokeless tobacco: Never Used  Substance and Sexual Activity  . Alcohol use: No    Alcohol/week: 0.0 oz  . Drug use: No  . Sexual activity: Yes    Birth control/protection: Condom  Lifestyle  . Physical activity:    Days per week: Not on file    Minutes per session: Not on file  . Stress: Not on file  Relationships  . Social connections:    Talks on phone: Not on file    Gets together: Not on file    Attends religious service: Not on file    Active member of club or organization: Not on file    Attends meetings of clubs or organizations: Not on file    Relationship status: Not on file  Other Topics Concern  . Not on file  Social History Narrative   Her adult son, who was living with her, has moved home to [place]. Her best friend, who also lived with her briefly, has moved back to [place] after a falling out over the friend's  unwillingness to share in the rent.    Family History  Problem Relation Age of Onset  . Lung cancer Mother        remission as of 06/2013  . Cancer Maternal Grandmother   . Hypertension Maternal Grandfather      ROS Review of Systems See HPI Constitution: No fevers or chills No malaise No diaphoresis Skin: No rash or itching Eyes: no blurry vision, no double vision GU: no dysuria or hematuria Neuro: no dizziness or headaches * all others reviewed and negative   Objective: There were no vitals filed for this visit.  Physical Exam  Assessment and Plan There are no diagnoses linked to this encounter.   Shivaun Bilello P [facility]

## 2017-07-20 ENCOUNTER — Other Ambulatory Visit: Payer: Self-pay | Admitting: Physician Assistant

## 2017-07-20 DIAGNOSIS — F419 Anxiety disorder, unspecified: Principal | ICD-10-CM

## 2017-07-20 DIAGNOSIS — F329 Major depressive disorder, single episode, unspecified: Secondary | ICD-10-CM

## 2017-07-20 NOTE — Telephone Encounter (Signed)
Refill request Klonopin  [name] 04/18/2017  Pharmacy on file

## 2017-08-10 ENCOUNTER — Other Ambulatory Visit: Payer: Self-pay | Admitting: Physician Assistant

## 2017-08-10 DIAGNOSIS — F419 Anxiety disorder, unspecified: Secondary | ICD-10-CM

## 2017-08-10 DIAGNOSIS — F329 Major depressive disorder, single episode, unspecified: Secondary | ICD-10-CM

## 2017-08-10 DIAGNOSIS — E119 Type 2 diabetes mellitus without complications: Secondary | ICD-10-CM

## 2017-08-10 DIAGNOSIS — R030 Elevated blood-pressure reading, without diagnosis of hypertension: Secondary | ICD-10-CM

## 2017-08-14 NOTE — Telephone Encounter (Signed)
Patient is requesting a refill of the following medications: Requested Prescriptions   Pending Prescriptions Disp Refills  . lisinopril (PRINIVIL,ZESTRIL) 5 MG tablet [Pharmacy Med Name: LISINOPRIL  TABLETS]  0    Sig: TAKE 1 TABLET(5 MG) BY MOUTH DAILY  . traZODone (DESYREL) 100 MG tablet [Pharmacy Med Name: TRAZODONE  TABLETS] 180 tablet 0    Sig: TAKE 2 TABLETS(200 MG) BY MOUTH AT BEDTIME   Signed Prescriptions Disp Refills  . amitriptyline (ELAVIL) 100 MG tablet 90 tablet 0    Sig: TAKE 1 TABLET(100 MG) BY MOUTH AT BEDTIME    Authorizing Provider: JEFFERY, CHELLE    Ordering User: Renee Pain  . metFORMIN (GLUCOPHAGE-XR) 500 MG 24 hr tablet 60 tablet 0    Sig: TAKE 2 TABLETS BY MOUTH DAILY WITH BREAKFAST. OFFICE VISIT NEEDED    Authorizing Provider: Porfirio Oar    Ordering User: Renee Pain    Date of patient request: 08/10/17 Last office visit: 04/18/17 Date of last refill: 05/07/17  Last refill amount: #180 0RF Follow up time period per chart: none scheduled at this time

## 2017-08-14 NOTE — Telephone Encounter (Signed)
Please call this patient.  1. We stopped the lisinopril in January due to cough. How long has she been back on it? Has the cough returned?  2. When does she plan to see me again? Or does she plan to follow one of my colleagues here? I need to know so that I authorize adequate refills. Since I last saw her in January, she is due for a follow-up and fasting labs.

## 2017-08-14 NOTE — Telephone Encounter (Signed)
[name]  04/18/17 Tracy Andrade

## 2017-08-15 NOTE — Telephone Encounter (Signed)
I see that the lisinopril request was denied, but I do not see that we contacted the patient as I requested. It is not my intention to deny the medication if she is taking it and not coughing.

## 2017-08-23 IMAGING — CR DG CHEST 2V
2 series · 2 of 2 positions shown · non-contrast
Comparison: 08/06/2014

CLINICAL DATA: Shortness of breath

EXAM:
CHEST  2 VIEW

[PA]
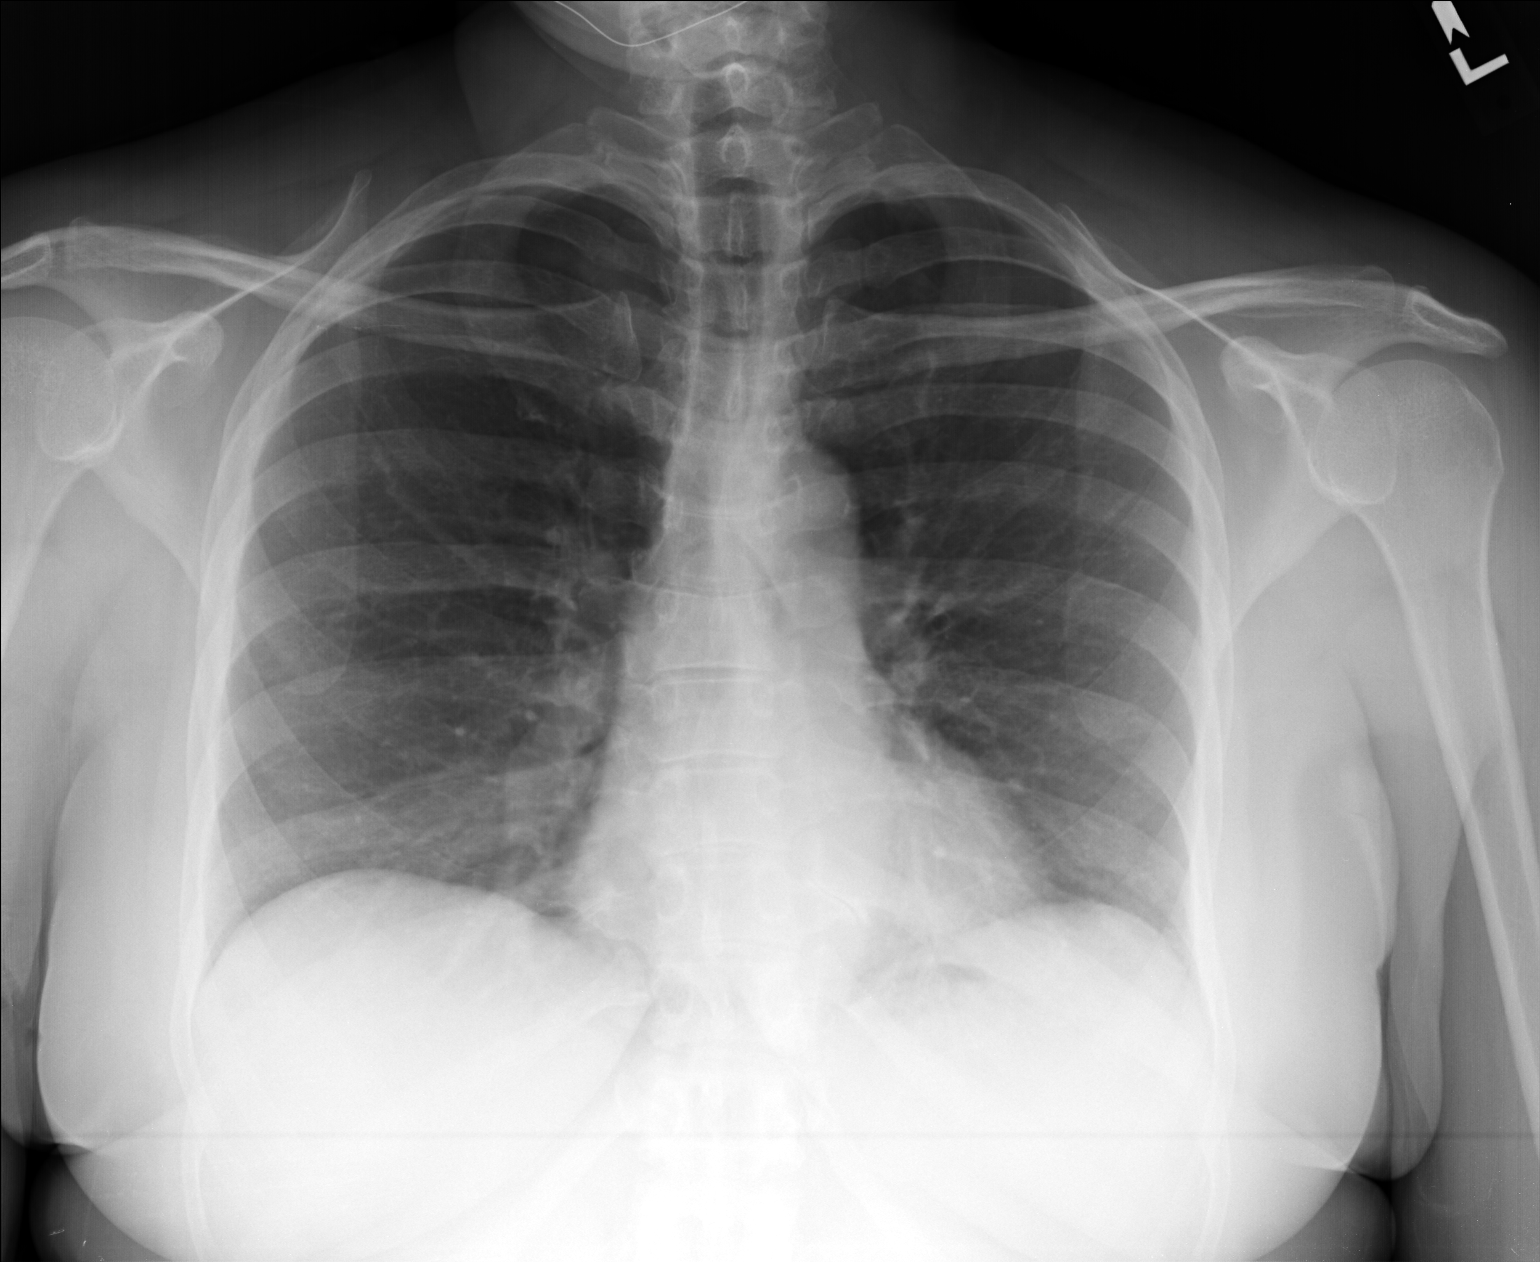

[lateral]
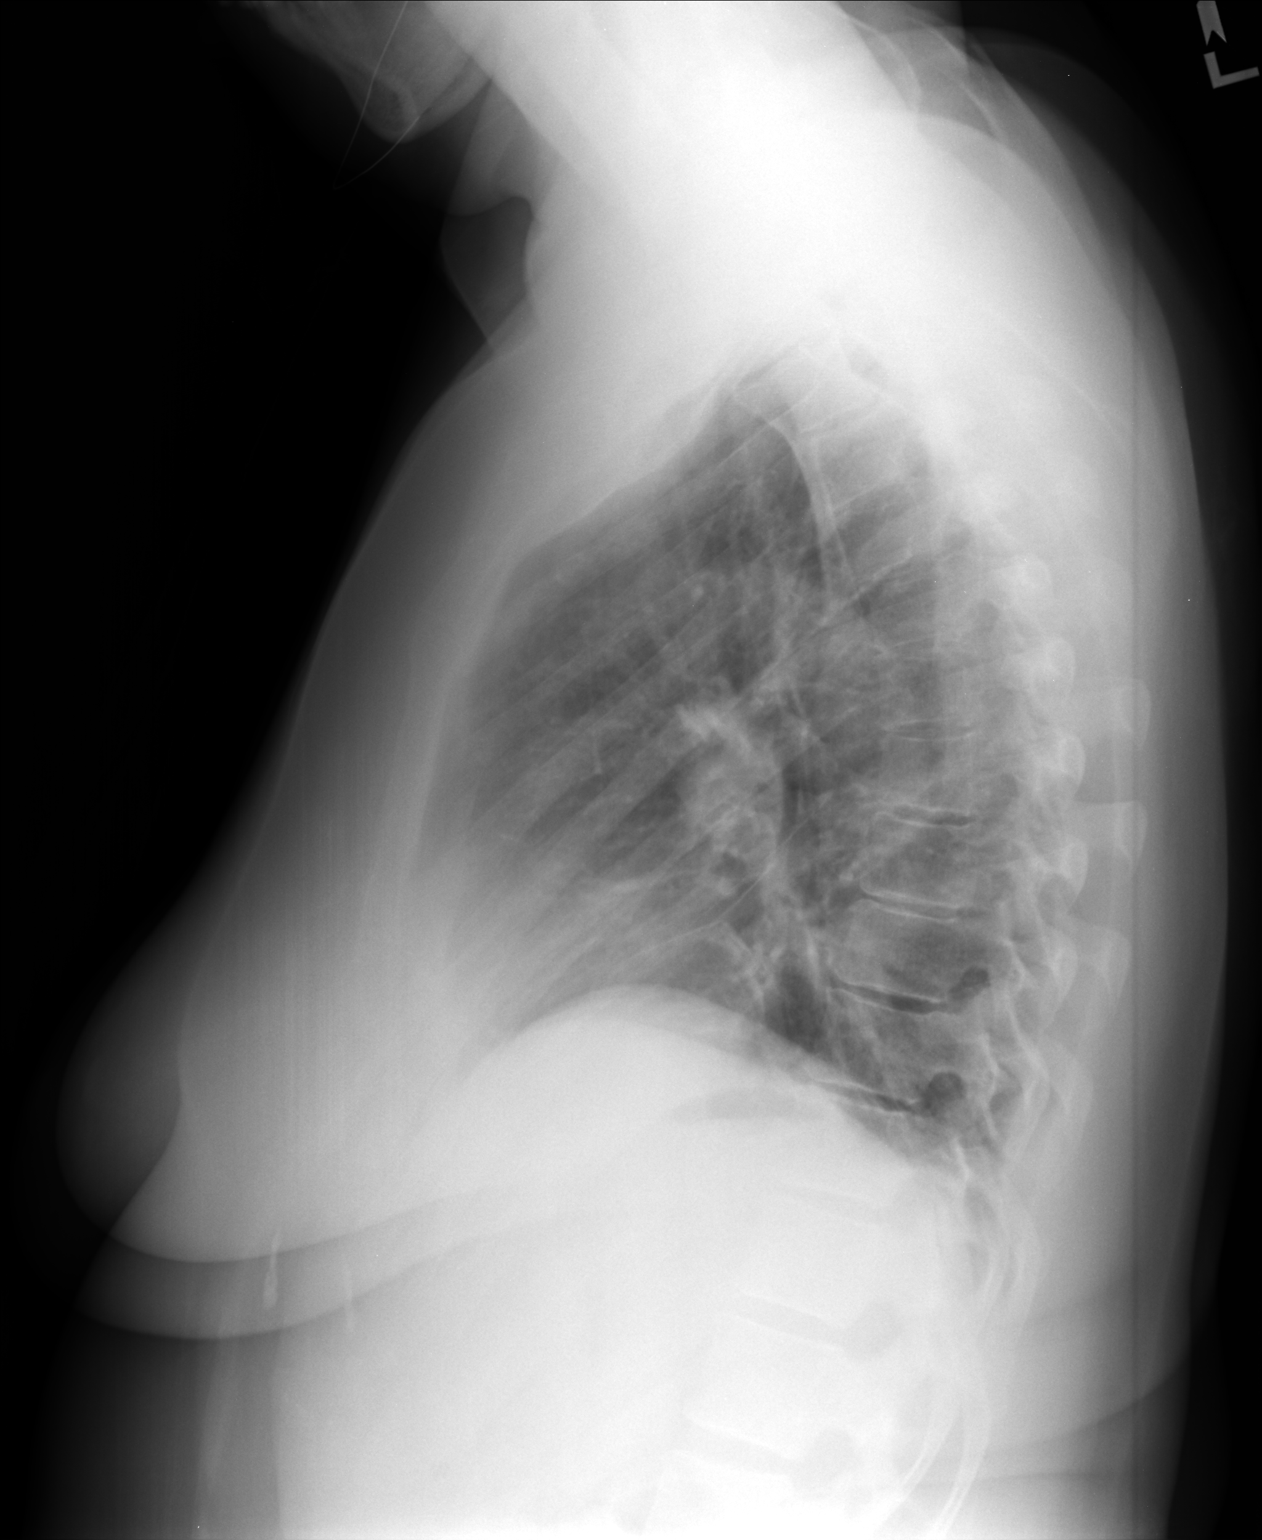

[2 of 2 positions shown; findings below may reference images not displayed]

FINDINGS: Lungs are clear.  No pleural effusion or pneumothorax.

The heart is normal in size.

Visualized osseous structures are within normal limits.
IMPRESSION: No evidence of acute cardiopulmonary disease.

## 2017-09-06 ENCOUNTER — Other Ambulatory Visit: Payer: Self-pay | Admitting: Physician Assistant

## 2017-09-06 DIAGNOSIS — G8929 Other chronic pain: Secondary | ICD-10-CM

## 2017-09-06 DIAGNOSIS — M5441 Lumbago with sciatica, right side: Principal | ICD-10-CM

## 2017-09-06 DIAGNOSIS — R030 Elevated blood-pressure reading, without diagnosis of hypertension: Secondary | ICD-10-CM

## 2017-09-06 DIAGNOSIS — E119 Type 2 diabetes mellitus without complications: Secondary | ICD-10-CM

## 2017-09-07 ENCOUNTER — Other Ambulatory Visit: Payer: Self-pay

## 2017-09-07 DIAGNOSIS — G8929 Other chronic pain: Secondary | ICD-10-CM

## 2017-09-07 DIAGNOSIS — M5441 Lumbago with sciatica, right side: Secondary | ICD-10-CM

## 2017-09-07 DIAGNOSIS — E119 Type 2 diabetes mellitus without complications: Secondary | ICD-10-CM

## 2017-09-07 MED ORDER — METFORMIN HCL ER 500 MG PO TB24: ORAL_TABLET | ORAL | 0 refills | Status: DC

## 2017-09-07 MED ORDER — GABAPENTIN 300 MG PO CAPS: 300.0000 mg | ORAL_CAPSULE | Freq: Three times a day (TID) | ORAL | 0 refills | Status: DC

## 2017-09-07 NOTE — Telephone Encounter (Signed)
Former pt of [employer] jeffrey,PA.  [name]: 04/18/17

## 2017-09-10 ENCOUNTER — Other Ambulatory Visit: Payer: Self-pay | Admitting: Physician Assistant

## 2017-09-10 DIAGNOSIS — M5441 Lumbago with sciatica, right side: Principal | ICD-10-CM

## 2017-09-10 DIAGNOSIS — G8929 Other chronic pain: Secondary | ICD-10-CM

## 2017-09-10 DIAGNOSIS — E119 Type 2 diabetes mellitus without complications: Secondary | ICD-10-CM

## 2017-09-11 NOTE — Telephone Encounter (Signed)
Patient called to discuss the [facility] [place], she says it is straightened out. Meds refilled on 09/07/17.

## 2017-09-17 ENCOUNTER — Other Ambulatory Visit: Payer: Self-pay | Admitting: Physician Assistant

## 2017-09-17 DIAGNOSIS — F419 Anxiety disorder, unspecified: Principal | ICD-10-CM

## 2017-09-17 DIAGNOSIS — F32A Depression, unspecified: Secondary | ICD-10-CM

## 2017-09-17 DIAGNOSIS — F329 Major depressive disorder, single episode, unspecified: Secondary | ICD-10-CM

## 2017-09-18 NOTE — Telephone Encounter (Signed)
Klonopin 2 mg refill request  [name] 04/18/17 with [name]  PCP:  Dr. Koren ShiverIrma Santiago  Last refill:  07/18/17  #90   0 refills.

## 2017-10-09 ENCOUNTER — Other Ambulatory Visit: Payer: Self-pay | Admitting: Physician Assistant

## 2017-10-09 DIAGNOSIS — G8929 Other chronic pain: Secondary | ICD-10-CM

## 2017-10-09 DIAGNOSIS — E119 Type 2 diabetes mellitus without complications: Secondary | ICD-10-CM

## 2017-10-09 DIAGNOSIS — M5441 Lumbago with sciatica, right side: Principal | ICD-10-CM

## 2017-10-10 NOTE — Telephone Encounter (Signed)
gabapentin refill Last Refill:09/07/17 # 90 Last OV: 04/18/17 PCP: Koren ShiverIrma Santiago MD [facility] [address]  Metformin refill Last Refill:09/07/17 # 60 Last OV: 08/01/16 Last Hgb A1 C 9.9   Called pt and left message to make office visit.

## 2017-10-11 NOTE — Telephone Encounter (Signed)
I have given the patient a 1 month supply in order for her to get an appt with her new PCP.  Can you please call and get her scheduled.

## 2017-10-11 NOTE — Telephone Encounter (Signed)
Patient is requesting a refill of the following medications: Requested Prescriptions   Pending Prescriptions Disp Refills  . gabapentin (NEURONTIN) 300 MG capsule [Pharmacy Med Name: GABAPENTIN 300MG  CAPSULES] 90 capsule 0    Sig: TAKE ONE CAPSULE BY MOUTH THREE TIMES DAILY  . metFORMIN (GLUCOPHAGE-XR) 500 MG 24 hr tablet [Pharmacy Med Name: METFORMIN ER 500MG  24HR TAB] 60 tablet 0    Sig: TAKE 2 TABLETS BY MOUTH DAILY WITH BREAKFAST    Date of patient request: 10/09/2017 Last office visit: 04/18/2017 Date of last refill: 09/07/2017 Last refill amount: 90 Follow up time period per chart:

## 2017-11-14 ENCOUNTER — Other Ambulatory Visit: Payer: Self-pay | Admitting: Family Medicine

## 2017-11-14 DIAGNOSIS — F419 Anxiety disorder, unspecified: Principal | ICD-10-CM

## 2017-11-14 DIAGNOSIS — F329 Major depressive disorder, single episode, unspecified: Secondary | ICD-10-CM

## 2017-11-14 NOTE — Telephone Encounter (Signed)
Copied from CRM [id number]. Topic: Quick Communication - Rx Refill/Question >> Nov 14, 2017  9:11 AM Oneal GroutSebastian, [name] wrote: Medication: traZODone (DESYREL) 100 MG tablet   Has the patient contacted their pharmacy? Yes.   (Agent: If no, request that the patient contact the pharmacy for the refill.) (Agent: If yes, when and what did the pharmacy advise?)  Preferred Pharm[facility] (with phone number or street name[facility] [address] [address]  Agent: Please be advised that RX refills may take up to 3 business days. We ask that you follow-up with your pharmacy.

## 2017-11-14 NOTE — Telephone Encounter (Signed)
Refill of desyrel  [name] 04/18/17 C. Tinnie GensJeffrey  [facility] 08/15/17  #180  0 refills  [facility] [address] [address]

## 2017-11-15 NOTE — Telephone Encounter (Signed)
Patient is requesting a refill of the following medications: Requested Prescriptions   Pending Prescriptions Disp Refills  . traZODone (DESYREL) 100 MG tablet 180 tablet 0    Date of patient request: 11/14/2017 Last office visit: 04/18/2017 Date of last refill: 08/15/2017 Last refill amount: 180 Follow up time period per chart:

## 2017-11-21 MED ORDER — TRAZODONE HCL 100 MG PO TABS
ORAL_TABLET | ORAL | 0 refills | Status: AC
Start: 2017-11-21 — End: ?

## 2018-01-17 ENCOUNTER — Telehealth: Payer: Self-pay | Admitting: Family Medicine

## 2018-01-17 NOTE — Telephone Encounter (Signed)
Pt needs to be set up with new provider Note from July stating the above. Please call and schedule appt asap with new provider.

## 2018-01-17 NOTE — Telephone Encounter (Signed)
Copied from CRM [id number]. Topic: Quick Communication - Rx Refill/Question >> Jan 17, 2018  8:45 AM Gaynelle Adu wrote: gabapentin (NEURONTIN) 300 MG capsule  metFORMIN (GLUCOPHAGE-XR) 500 MG 24 hr tablet   [id number] [place] - [address] AT [facility] [phone] (Phone) [phone] (Fax)

## 2018-01-18 ENCOUNTER — Telehealth: Payer: Self-pay | Admitting: General Practice

## 2018-01-18 NOTE — Telephone Encounter (Signed)
Per Rose RN called to pt to set up appt. Could not leave VM due to no DPR   When pt calls back please schedule establish care appt with [facility] or [place]

## 2018-02-20 ENCOUNTER — Other Ambulatory Visit: Payer: Self-pay | Admitting: Family Medicine

## 2018-02-20 DIAGNOSIS — F329 Major depressive disorder, single episode, unspecified: Secondary | ICD-10-CM

## 2018-02-20 DIAGNOSIS — F419 Anxiety disorder, unspecified: Principal | ICD-10-CM

## 2018-02-20 NOTE — Telephone Encounter (Signed)
Left message to call to schedule an OV.

## 2018-02-20 NOTE — Telephone Encounter (Signed)
Copied from CRM [id number]. Topic: Quick Communication - Rx Refill/Question >> Feb 20, 2018  8:37 AM Wyonia HoughJohnson, Chaz E wrote:  Medication: amitriptyline (ELAVIL) 100 MG tablet   Has the patient contacted their pharmacy? Yes.  (Pharmacy sent a request and called to check status. Needs to get meds ASAP(by today 12.04.2019 or tomorrow 12.05.2019) due to having a shipment of meds ready to ship to the Pt.   Preferred [facility] (with phone number or street name[id number] [place] - [address] AT [facility] [phone] (Phone) [phone] (Fax)

## 2018-02-20 NOTE — Telephone Encounter (Signed)
Requested medication (s) are due for refill today: Yes  Requested medication (s) are on the active medication list: Yes  Last refill:  08/14/17  Future visit scheduled: No  Notes to clinic:  Left message to schedule OV    Requested Prescriptions  Pending Prescriptions Disp Refills   amitriptyline (ELAVIL) 100 MG tablet 90 tablet 0     Psychiatry:  Antidepressants - Heterocyclics (TCAs) Failed - 02/20/2018 10:26 AM      Failed - Valid encounter within last 6 months    Recent Outpatient Visits          10 months ago Cough   [facility], [place]   1 year ago Community acquired pneumonia, unspecified laterality   [facility] [place]   1 year ago Type 2 diabetes mellitus without complication, without long-term current use of insulin (HCC)   [facility], [place]   2 years ago Type 2 diabetes mellitus without complication, without long-term current use of insulin (HCC)   [facility] [place]   2 years ago Cough   [facility], [place]             Passed - Completed PHQ-2 or PHQ-9 in the last 360 days.    Yes

## 2018-02-22 NOTE — Telephone Encounter (Signed)
Pt's pharmacy called to follow up on this refill request.  Per previous phone messages, pt needs appt to see a new pcp, as pt has not been seen for med refills in over a year.e Pharmacy is going to let the  pt know to call and make an appt for any future refills.

## 2018-03-21 ENCOUNTER — Other Ambulatory Visit: Payer: Self-pay | Admitting: Family Medicine

## 2018-03-21 DIAGNOSIS — F329 Major depressive disorder, single episode, unspecified: Secondary | ICD-10-CM

## 2018-03-21 DIAGNOSIS — F419 Anxiety disorder, unspecified: Principal | ICD-10-CM

## 2019-07-24 IMAGING — DX DG LUMBAR SPINE COMPLETE 4+V
5 series · 5 of 5 positions shown · non-contrast
Comparison: 08/23/2015

CLINICAL DATA: Low back pain and right leg pain.

EXAM:
LUMBAR SPINE - COMPLETE 4+ VIEW

[l-spine ap]
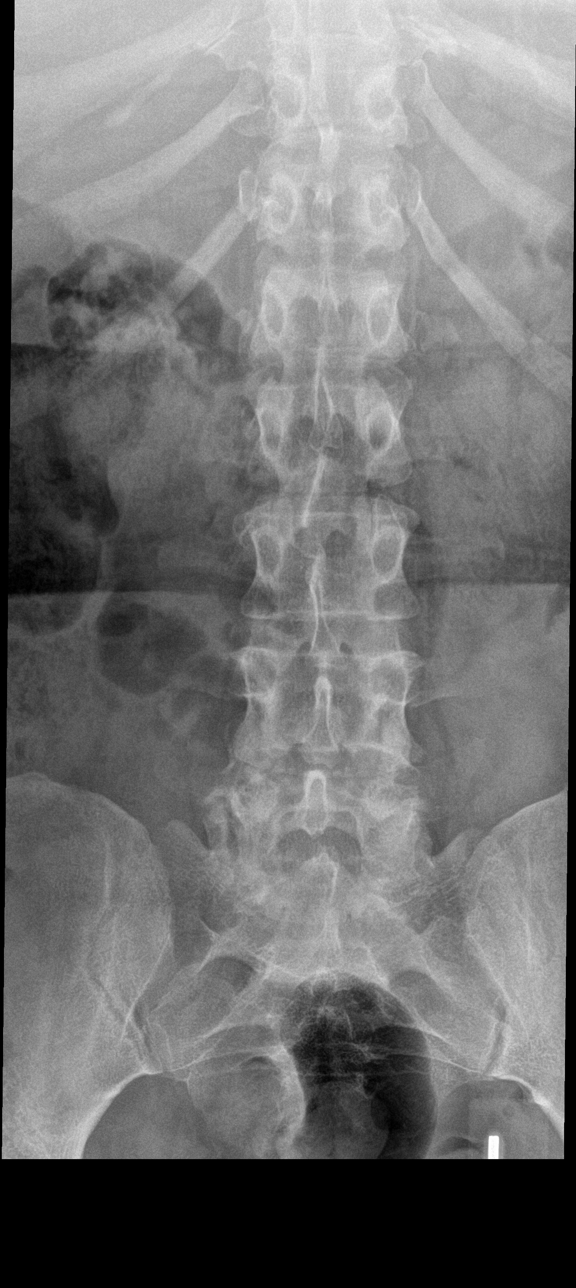

[l-spine obl (1 of 2)]
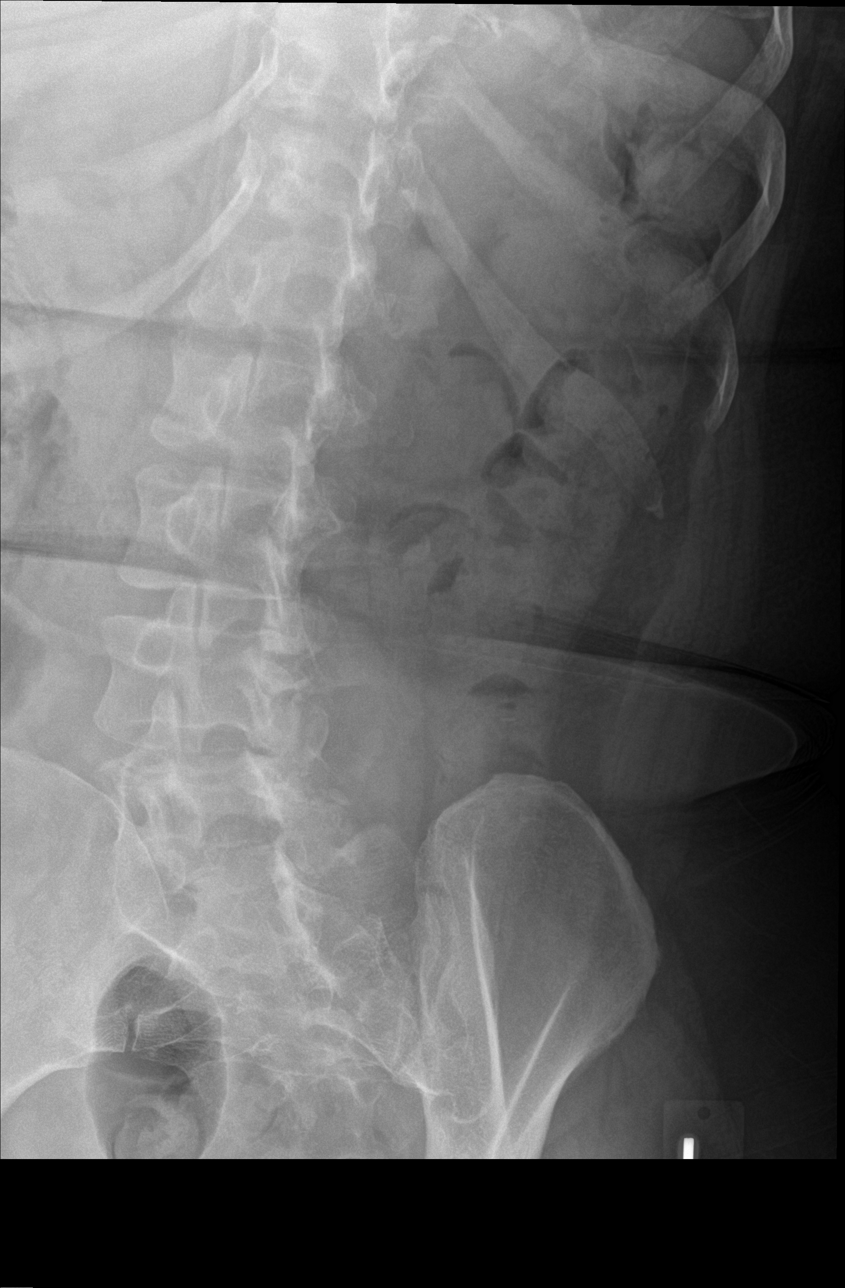

[l-spine obl (2 of 2)]
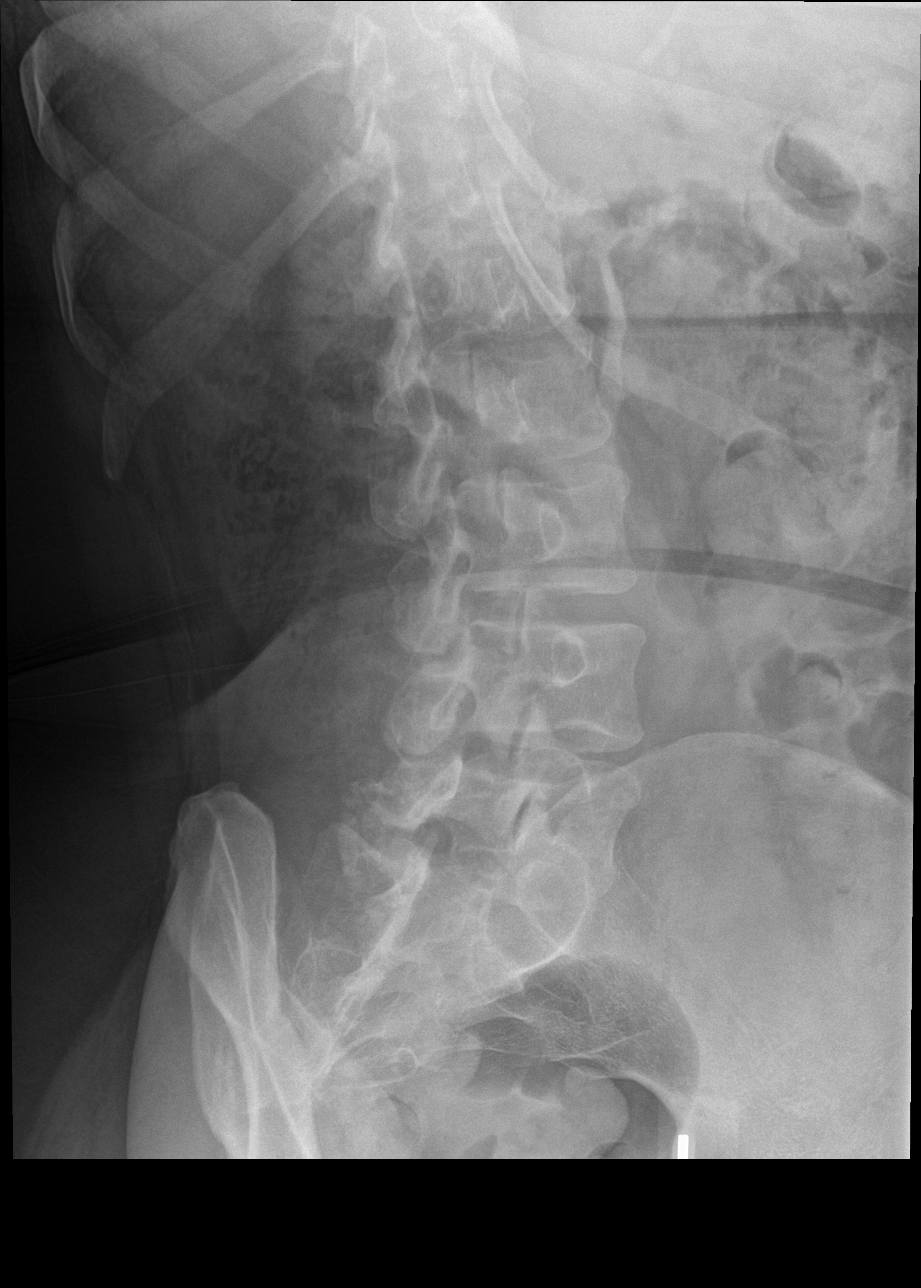

[l-spine lat]
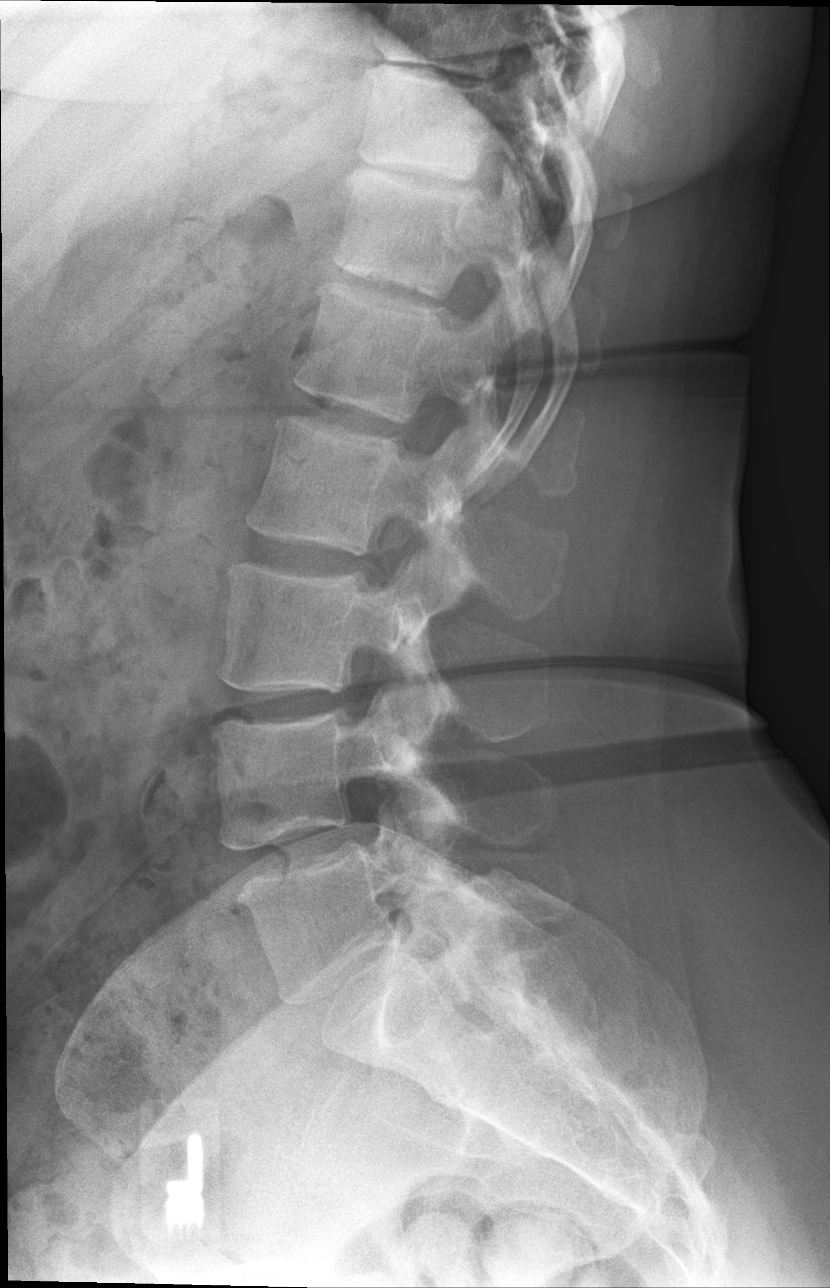

[l-spine l5-s1]
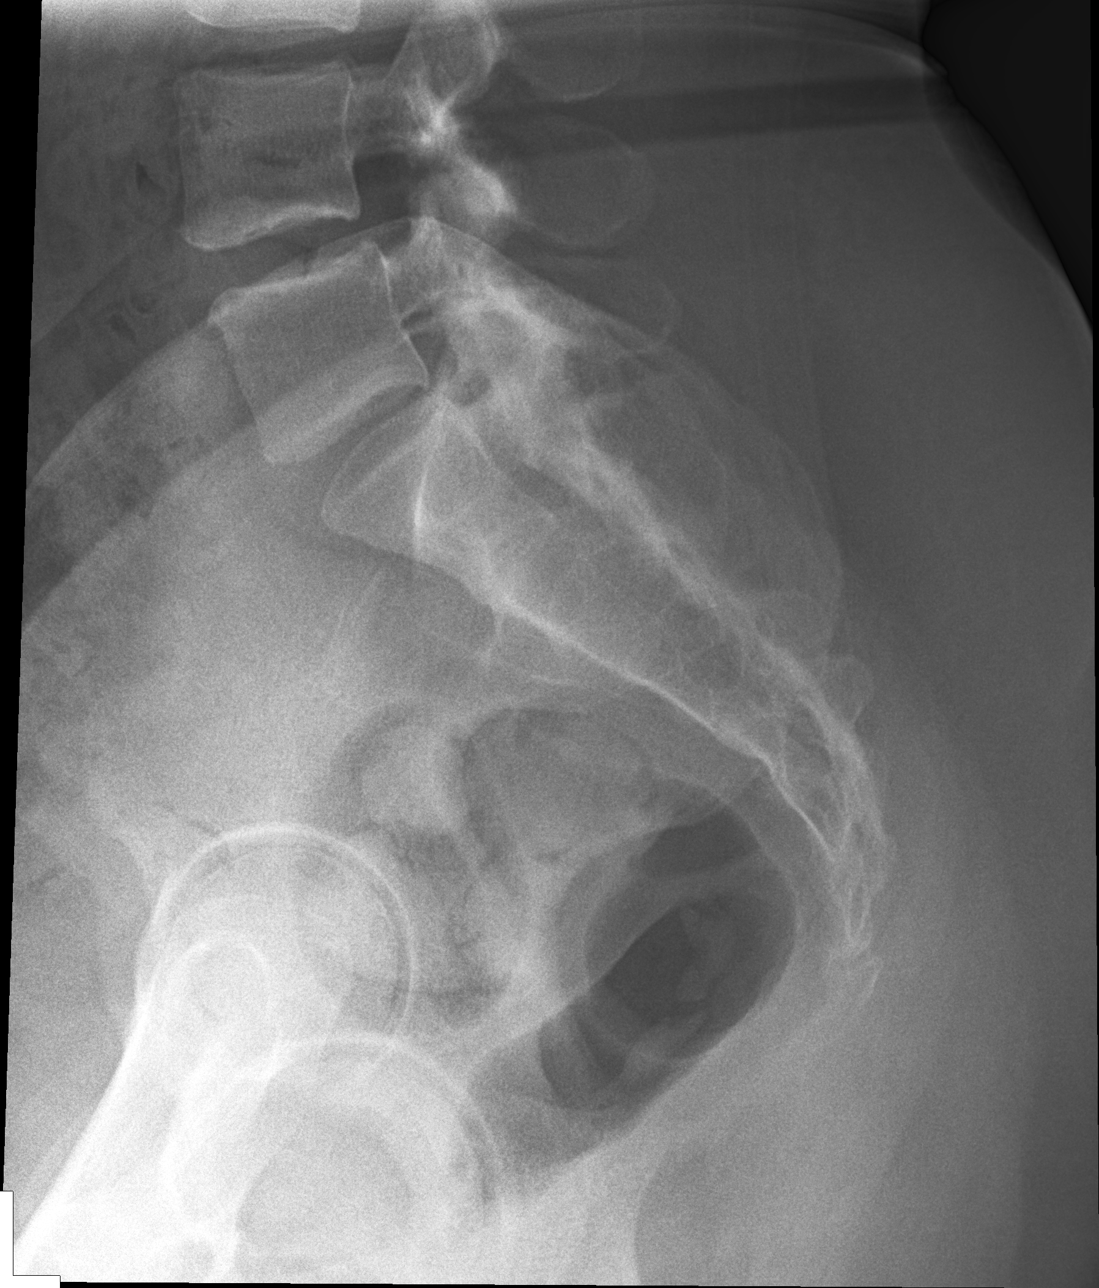

[5 of 5 positions shown; findings below may reference images not displayed]

FINDINGS: Five lumbar type vertebral bodies. No scoliosis. 3 mm
anterolisthesis L4-5. No disc space narrowing. Lower lumbar facet
arthropathy, most pronounced at L4-5 and L5-S1. Sacroiliac joints
appear normal. There is progressive degenerative change at the
T11-12 level with more disc space narrowing and endplate sclerosis.
IMPRESSION: Chronic lower lumbar facet arthropathy. 3 mm of anterolisthesis now
present at L4-5. Disc space heights within normal limits.

Progressive degenerative disc disease at T11-12 with disc space
narrowing and sclerotic change.
